# Patient Record
Sex: Male | Born: 1975 | Race: Black or African American | Hispanic: No | Marital: Married | Smoking: Never smoker
Health system: Southern US, Community
[De-identification: ages and names within clinical notes are randomized; demographics above are authoritative.]

## PROBLEM LIST (undated history)

## (undated) DIAGNOSIS — E119 Type 2 diabetes mellitus without complications: Secondary | ICD-10-CM

## (undated) DIAGNOSIS — F909 Attention-deficit hyperactivity disorder, unspecified type: Secondary | ICD-10-CM

## (undated) DIAGNOSIS — E785 Hyperlipidemia, unspecified: Secondary | ICD-10-CM

## (undated) DIAGNOSIS — I10 Essential (primary) hypertension: Secondary | ICD-10-CM

## (undated) HISTORY — PX: KNEE ARTHROSCOPY: SUR90

## (undated) HISTORY — PX: SHOULDER SURGERY: SHX246

## (undated) HISTORY — DX: Attention-deficit hyperactivity disorder, unspecified type: F90.9

## (undated) HISTORY — PX: NASAL SEPTUM SURGERY: SHX37

## (undated) HISTORY — DX: Hyperlipidemia, unspecified: E78.5

---

## 2014-04-26 ENCOUNTER — Ambulatory Visit: Payer: Self-pay | Admitting: Endocrinology

## 2014-04-27 ENCOUNTER — Emergency Department (HOSPITAL_COMMUNITY)
Admission: EM | Admit: 2014-04-27 | Discharge: 2014-04-27 | Disposition: A | Discharge status: Home / Self Care | Payer: BLUE CROSS/BLUE SHIELD | Attending: Emergency Medicine | Admitting: Emergency Medicine

## 2014-04-27 ENCOUNTER — Encounter (HOSPITAL_COMMUNITY): Payer: Self-pay | Admitting: Emergency Medicine

## 2014-04-27 DIAGNOSIS — R112 Nausea with vomiting, unspecified: Secondary | ICD-10-CM

## 2014-04-27 DIAGNOSIS — R6883 Chills (without fever): Secondary | ICD-10-CM | POA: Diagnosis not present

## 2014-04-27 DIAGNOSIS — I10 Essential (primary) hypertension: Secondary | ICD-10-CM | POA: Insufficient documentation

## 2014-04-27 DIAGNOSIS — E109 Type 1 diabetes mellitus without complications: Secondary | ICD-10-CM | POA: Diagnosis not present

## 2014-04-27 HISTORY — DX: Type 2 diabetes mellitus without complications: E11.9

## 2014-04-27 HISTORY — DX: Essential (primary) hypertension: I10

## 2014-04-27 LAB — URINE MICROSCOPIC-ADD ON

## 2014-04-27 LAB — CBC WITH DIFFERENTIAL/PLATELET
BASOS ABS: 0 10*3/uL (ref 0.0–0.1)
BASOS PCT: 0 % (ref 0–1)
EOS ABS: 0.1 10*3/uL (ref 0.0–0.7)
Eosinophils Relative: 2 % (ref 0–5)
HCT: 45.9 % (ref 39.0–52.0)
Hemoglobin: 15.3 g/dL (ref 13.0–17.0)
LYMPHS ABS: 0.5 10*3/uL — AB (ref 0.7–4.0)
LYMPHS PCT: 6 % — AB (ref 12–46)
MCH: 26.6 pg (ref 26.0–34.0)
MCHC: 33.3 g/dL (ref 30.0–36.0)
MCV: 79.7 fL (ref 78.0–100.0)
Monocytes Absolute: 0.7 10*3/uL (ref 0.1–1.0)
Monocytes Relative: 9 % (ref 3–12)
NEUTROS PCT: 83 % — AB (ref 43–77)
Neutro Abs: 6.7 10*3/uL (ref 1.7–7.7)
PLATELETS: 220 10*3/uL (ref 150–400)
RBC: 5.76 MIL/uL (ref 4.22–5.81)
RDW: 13.5 % (ref 11.5–15.5)
WBC: 8 10*3/uL (ref 4.0–10.5)

## 2014-04-27 LAB — COMPREHENSIVE METABOLIC PANEL
ALT: 30 U/L (ref 0–53)
AST: 34 U/L (ref 0–37)
Albumin: 3.7 g/dL (ref 3.5–5.2)
Alkaline Phosphatase: 87 U/L (ref 39–117)
Anion gap: 10 (ref 5–15)
BUN: 14 mg/dL (ref 6–23)
CALCIUM: 8 mg/dL — AB (ref 8.4–10.5)
CO2: 23 mmol/L (ref 19–32)
Chloride: 102 mmol/L (ref 96–112)
Creatinine, Ser: 1.23 mg/dL (ref 0.50–1.35)
GFR calc Af Amer: 83 mL/min — ABNORMAL LOW (ref >90)
GFR, EST NON AFRICAN AMERICAN: 72 mL/min — AB (ref >90)
Glucose, Bld: 254 mg/dL — ABNORMAL HIGH (ref 70–99)
Potassium: 4 mmol/L (ref 3.5–5.1)
SODIUM: 135 mmol/L (ref 135–145)
Total Bilirubin: 0.8 mg/dL (ref 0.3–1.2)
Total Protein: 6.7 g/dL (ref 6.0–8.3)

## 2014-04-27 LAB — URINALYSIS, ROUTINE W REFLEX MICROSCOPIC
Glucose, UA: >1000 mg/dL — AB
HGB URINE DIPSTICK: NEGATIVE
Ketones, ur: >80 mg/dL — AB
LEUKOCYTES UA: NEGATIVE
NITRITE: NEGATIVE
Protein, ur: 100 mg/dL — AB
SPECIFIC GRAVITY, URINE: 1.037 — AB (ref 1.005–1.030)
Urobilinogen, UA: 0.2 mg/dL (ref 0.0–1.0)
pH: 5 (ref 5.0–8.0)

## 2014-04-27 LAB — LIPASE, BLOOD: Lipase: 24 U/L (ref 11–59)

## 2014-04-27 LAB — I-STAT TROPONIN, ED: TROPONIN I, POC: 0.01 ng/mL (ref 0.00–0.08)

## 2014-04-27 LAB — CBG MONITORING, ED: Glucose-Capillary: 210 mg/dL — ABNORMAL HIGH (ref 70–99)

## 2014-04-27 MED ORDER — ONDANSETRON HCL 4 MG/2ML IJ SOLN
4.0000 mg | Freq: Once | INTRAMUSCULAR | Status: AC
  Administered 2014-04-27: 4 mg via INTRAVENOUS
  Filled 2014-04-27: qty 2

## 2014-04-27 MED ORDER — SODIUM CHLORIDE 0.9 % IV BOLUS (SEPSIS)
1000.0000 mL | Freq: Once | INTRAVENOUS | Status: AC
  Administered 2014-04-27: 1000 mL via INTRAVENOUS

## 2014-04-27 MED ORDER — ONDANSETRON 4 MG PO TBDP: 4.0000 mg | ORAL_TABLET | Freq: Three times a day (TID) | ORAL | Status: DC | PRN

## 2014-04-27 NOTE — ED Provider Notes (Signed)
CSN: [id number]     Arrival date & time 04/27/14  0801 History   First MD Initiated Contact with Patient 04/27/14 [phone]     Chief Complaint  Patient presents with  . Emesis     (Consider location/radiation/quality/duration/timing/severity/associated sxs/prior Treatment) The history is provided by the patient and medical records.   This is a 39 y.o. M with PMH significant for HTN, DM1 on insulin pump, presenting to the ED for nausea and vomiting onset this morning around 0300.  Patient states he has had approx 4 episodes of non-bloody, non-bilious emesis since onset.  He endorses generalized abdominal cramping which he attributes to vomiting, no focal tenderness.  He reports cold sweats and chills with vomiting but denies fever.  Denies recent changes in diet, last PO intake last evening was pizza.  Patient is type 1 diabetic on insulin pump.  States his CBG's have been running higher than normal lately so he changed his port with improvement of readings.  CBG here 210.   No chest pain, SOB, palpitations, dizziness, weakness.  VSS on arrival. PCP [facility], now switching [facility] but has not established care yet.  No past medical history on file. No past surgical history on file. No family history on file. History  Substance Use Topics  . Smoking status: Not on file  . Smokeless tobacco: Not on file  . Alcohol Use: Not on file    Review of Systems  Constitutional: Positive for chills.  Gastrointestinal: Positive for nausea and vomiting.  All other systems reviewed and are negative.     Allergies  Review of patient's allergies indicates not on file.  Home Medications   Prior to Admission medications   Not on File   BP 157/97 mmHg  Pulse 105  Temp(Src) 99.5 F (37.5 C) (Oral)  Resp 20  SpO2 99%   Physical Exam  Constitutional: He is oriented to person, place, and time. He appears well-developed and well-nourished.  HENT:  Head:  Normocephalic and atraumatic.  Mouth/Throat: Uvula is midline and oropharynx is clear and moist.  Mildly dry mucous membranes  Eyes: Conjunctivae and EOM are normal. Pupils are equal, round, and reactive to light.  Neck: Normal range of motion.  Cardiovascular: Normal rate, regular rhythm and normal heart sounds.   Pulmonary/Chest: Effort normal and breath sounds normal.  Abdominal: Soft. Bowel sounds are normal. There is no tenderness. There is no rigidity, no guarding and no CVA tenderness.  Abdomen soft, non-distended, no focal tenderness or peritoneal signs  Musculoskeletal: Normal range of motion.  Neurological: He is alert and oriented to person, place, and time.  Skin: Skin is warm and dry.  Psychiatric: He has a normal mood and affect.  Nursing note and vitals reviewed.   ED Course  Procedures (including critical care time) Labs Review Labs Reviewed  CBC WITH DIFFERENTIAL/PLATELET - Abnormal; Notable for the following:    Neutrophils Relative % 83 (*)    Lymphocytes Relative 6 (*)    Lymphs Abs 0.5 (*)    All other components within normal limits  COMPREHENSIVE METABOLIC PANEL - Abnormal; Notable for the following:    Glucose, Bld 254 (*)    Calcium 8.0 (*)    GFR calc non Af Amer 72 (*)    GFR calc Af Amer 83 (*)    All other components within normal limits  URINALYSIS, ROUTINE W REFLEX MICROSCOPIC - Abnormal; Notable for the following:    Specific Gravity,  Urine 1.037 (*)    Glucose, UA >1000 (*)    Bilirubin Urine SMALL (*)    Ketones, ur >80 (*)    Protein, ur 100 (*)    All other components within normal limits  CBG MONITORING, ED - Abnormal; Notable for the following:    Glucose-Capillary 210 (*)    All other components within normal limits  LIPASE, BLOOD  URINE MICROSCOPIC-ADD ON  I-STAT TROPOININ, ED    Imaging Review No results found.   EKG Interpretation   Date/Time:  Friday April 27 2014 09:10:27 EST Ventricular Rate:  103 PR Interval:   167 QRS Duration: 90 QT Interval:  359 QTC Calculation: 470 R Axis:   98 Text Interpretation:  Sinus tachycardia Atrial premature complexes  Consider left atrial enlargement Borderline right axis deviation No  previous ECGs available Confirmed by Bebe Shaggy  MD, Dorinda Hill ([id number]) on  04/27/2014 9:15:31 AM      MDM   Final diagnoses:  Nausea and vomiting, vomiting of unspecified type   39 year old male with nausea and vomiting onset this morning. Patient is type I diabetic. Patient currently afebrile and nontoxic in appearance. His abdominal exam is benign. She does have mildly dry mucous membranes. He reports recent glucose has been higher than normal but improved after changing his port on his insulin pump. CBG here 210. Will obtain labs. IV fluids and Zofran given.  10:00 AM  Lab work reassuring, normal WBC count.  Glucose elevated at 254 however anion gap remains WNL at 10.  Bicarb also WNL.  Clinically not DKA.  Patient has been hydrated here in the ED and states he is feeling better.  Abdominal exam remains benign.  Will PO challenge, anticipate discharge.  Patient has tolerated PO without difficulty.  No recurrence of symptoms.  Patient has been mildly tachycardic in the ED, suspect due to dehydration.  No chest pain or SOB to suggest PE.  Encouraged to continue fluids at home and monitor CBG closely.  Rx zofran.  FU with PCP.  Discussed plan with patient, he/she acknowledged understanding and agreed with plan of care.  Return precautions given for new or worsening symptoms.  Garlon Hatchet, PA-C 04/27/14 1426  Joya Gaskins, MD 04/28/14 [phone]

## 2014-04-27 NOTE — ED Notes (Addendum)
Pt reports sudden onset of nausea, vomiting this AM around 0300. Reports vomited atleast 4 times. Reports chills. States type 1 diabetic.

## 2014-04-27 NOTE — Discharge Instructions (Signed)
Take the prescribed medication as directed for nausea.  May wish to start with bland diet and progress back to normal as tolerated. Continue monitoring CBG closely. Follow-up with your primary care physician. Return to the ED for new or worsening symptoms.

## 2014-04-27 NOTE — ED Notes (Signed)
Notified RN of CBG 210

## 2014-06-26 ENCOUNTER — Encounter: Payer: Self-pay | Admitting: Internal Medicine

## 2014-06-26 ENCOUNTER — Ambulatory Visit (INDEPENDENT_AMBULATORY_CARE_PROVIDER_SITE_OTHER): Payer: BLUE CROSS/BLUE SHIELD | Admitting: Internal Medicine

## 2014-06-26 VITALS — BP 130/88 | HR 95 | Temp 98.3°F | Resp 12 | Ht 71.0 in | Wt 255.0 lb

## 2014-06-26 DIAGNOSIS — E1065 Type 1 diabetes mellitus with hyperglycemia: Secondary | ICD-10-CM

## 2014-06-26 DIAGNOSIS — IMO0002 Reserved for concepts with insufficient information to code with codable children: Secondary | ICD-10-CM

## 2014-06-26 DIAGNOSIS — Z7689 Persons encountering health services in other specified circumstances: Secondary | ICD-10-CM

## 2014-06-26 DIAGNOSIS — Z7189 Other specified counseling: Secondary | ICD-10-CM

## 2014-06-26 LAB — COMPREHENSIVE METABOLIC PANEL
ALT: 28 U/L (ref 0–53)
AST: 15 U/L (ref 0–37)
Albumin: 4.1 g/dL (ref 3.5–5.2)
Alkaline Phosphatase: 90 U/L (ref 39–117)
BUN: 18 mg/dL (ref 6–23)
CALCIUM: 9.1 mg/dL (ref 8.4–10.5)
CO2: 30 mEq/L (ref 19–32)
CREATININE: 1.15 mg/dL (ref 0.40–1.50)
Chloride: 99 mEq/L (ref 96–112)
GFR: 90.23 mL/min (ref >60.00)
Glucose, Bld: 320 mg/dL — ABNORMAL HIGH (ref 70–99)
Potassium: 4.1 mEq/L (ref 3.5–5.1)
SODIUM: 132 meq/L — AB (ref 135–145)
Total Bilirubin: 0.6 mg/dL (ref 0.2–1.2)
Total Protein: 6.8 g/dL (ref 6.0–8.3)

## 2014-06-26 LAB — TSH: TSH: 0.99 u[IU]/mL (ref 0.35–4.50)

## 2014-06-26 LAB — HEMOGLOBIN A1C: HEMOGLOBIN A1C: 10.2 % — AB (ref 4.6–6.5)

## 2014-06-26 MED ORDER — GLUCAGON (RDNA) 1 MG IJ KIT: 1.0000 mg | PACK | Freq: Once | INTRAMUSCULAR | Status: DC | PRN

## 2014-06-26 NOTE — Patient Instructions (Addendum)
Please change the pump settings as follows: - basal rates: 12 am: 2.0 units/h - ICR:  12 am: 8 >> 6 5 pm: 7 >> 6 - target: 100-100 - ISF: 20 - Insulin on Board: 4h - bolus wizard: on  Please check sugars before every meal and start the boluses 15 min before a meal.  Please change the pump site every 3 days.  Please stop at the lab.  Please return in 5 weeks.  Please schedule an appt with Cristy FolksLinda Spagnola for diabetes education in ~2-3 weeks.  Basic Rules for Patients with Type I Diabetes Mellitus  1. The American Diabetes Association (ADA) recommended targets: - fasting sugar <130 - after meal sugar <180 - HbA1C <7%  2. Engage in ?150 min moderate exercise per week  3. Make sure you have ?8h of sleep every night as this helps both blood sugars and your weight.  4. Always keep a sugar log (not only record in your meter) and bring it to all appointments with us.  5. If you are on a pump, know how to access the settings and to modify the parameters.  6.  Remember, you can always call the number on the back of the pump for emergencies related to the pump.  7. "15-15 rule" for hypoglycemia: if sugars are low, take 15 g of carbs** ("fast sugar" - e.g. 4 glucose tablets, 4 oz orange juice), wait 15 min, then check sugars again. If still <80, repeat. Continue  until your sugars >80, then eat a normal meal.   8. Teach family members and coworkers to inject glucagon. Have a glucagon set at home and one at work. They should call 911 after using the set.  9. If you are on a pump, set "insulin on board" time for 5 hours (if your sugars tend to be higher, can use 4 hours).   10. If you are on a pump, use the "dual wave bolus" setting for high fat foods (e.g. pizza). Start with a setting of 50%-50% (50% instant bolus and 50% prolonged bolus over 3h, for e.g.).    11. If you are on a pump, make sure the basal daily insulin dose is approximately equal (not larger) to the daily insulin you  get from boluses, otherwise you are at risk for hypoglycemia.  12. Check sugar before driving. If <100, correct, and only start driving if sugars rise ?829100. Check sugar every hour when on a long drive.  13. Check sugar before exercising. If <100, correct, and only start exercising if sugars rise ?100. Check sugar every hour when on a long exercise routine and 1h after you finished exercising.   If >250, check urine for ketones. If you have moderate-large ketones in urine, do not start exercise. Hydrate yourself with clear liquids and correct the high sugar. Recheck sugars and ketones before attempting to exercise.  Be aware that you might need less insulin when exercising.  *intense, short, exercise bursts can increase your sugars, but  *less intense, longer (>1h), exercise routines can decrease your sugars.  If you are on a pump, you might need to decrease your basal rate by 10% or more (or even disconnect your pump) while you exercise to prevent low sugars. Do not disconnect your pump by more than 3 hours at a time! You also might need to decrease your insulin bolus for the meal prior to your exercise time by 20% or more.  14. Make sure you have a MedAlert bracelet or pendant  mentioning "Type I Diabetes Mellitus". If you have a prior episode of severe hypoglycemia or hypoglycemia unawareness, it should also mention this.  15. Please do not walk barefoot. Inspect your feet for sores/cuts and let us know if you have them.  16. Please call [facility] with any questions and concerns [phone]).   **E.g. of "fast carbs": ? first choice (15 g):  1 tube glucose gel, GlucoPouch 15, 2 oz glucose liquid ? second choice (15-16 g):  3 or 4 glucose tablets (best taken  with water), 15 Dextrose Bits chewable ? third choice (15-20 g):   cup fruit juice,  cup regular soda, 1 cup skim milk,  1 cup sports drink ? fourth choice (15-20 g):  1 small tube [place] gel (not  frosting), 2 tbsp raisins, 1 tbsp table sugar,  candy, jelly beans, gum drops - check package for carb amount   (adapted from: Juluis Rainier. "Insulin therapy and hypoglycemia" Endocrinol Metab Clin N Am 2012, 41: 57-87)

## 2014-06-26 NOTE — Progress Notes (Signed)
Patient ID: Johnny Hicks, male   DOB: [birth year], 39 y.o.   MRN: [id number]  HPI: Johnny Hicks is a 39 y.o.-year-old male, self- referred, for management of DM1, dx 2001, uncontrolled, without complications.  Last hemoglobin A1c was: 11/2013: HbA1c in the 9's 04/08/2012: GAD Ab 4.8 (<1.0)  Pt is on an insulin pump: Paradigm Revel 723 (Medtronic) - 39 years old, without a CGM, uses Novolog in the pump.  Pump settings: - basal rates: 12 am: 2.0 units/h TDD from basal insulin: 46.3 units - ICR:  12 am: 8 5 pm: 7 - target: 100-100 - ISF: 20 - Insulin on Board: 4h - bolus wizard: on, uses it 100% time TDD from bolus insulin: 40.3 units - extended bolusing: not using - changes infusion site: q3-4 days - Meter: Bayer Contour Link  Pt checks his sugars 2.4x a day and they are - ave 341 +/- 151:  - am: 58-370 - 2h after b'fast: 205-370 - before lunch: 205>400 - 2h after lunch: 47-400 - before dinner: 192->400 - 2h after dinner: 197->400 - bedtime: see above - nighttime: >400 No lows. Lowest sugar was 40 - when dieting; he has hypoglycemia awareness at 60-70. No previous hypoglycemia admission. Has an expired glucagon kit at home. Highest sugar was Hi. No previous DKA admissions.    Pt's meals are: - Breakfast: usually skips - Brunch (10-11 am): meat + starch + veggies (60g carbs) - Dinner (4-5 pm or later if busy at work): ? - Snacks: usually not; but recently had some at night: chips, etc.  He was previously on a restricted diet at the beginning of the year and he started to have low CBGs, so he had to adjust his pump settings then. He walks or plays basketball 3-5 times a week.  - no CKD, last BUN/creatinine:  Lab Results  Component Value Date   BUN 14 04/27/2014   CREATININE 1.23 04/27/2014  On Lisinopril. - Has HL He was on Lipitor 20. Now off for 3 months.  - last eye exam was in 2105. No DR.  - no numbness and tingling in his feet. - + decreased  erections  Pt has FH of DM in brother.  ROS: Constitutional: no weight gain/loss, no fatigue, no subjective hyperthermia/hypothermia Eyes: no blurry vision, no xerophthalmia ENT: no sore throat, no nodules palpated in throat, no dysphagia/odynophagia, no hoarseness Cardiovascular: no CP/SOB/palpitations/leg swelling Respiratory: no cough/SOB Gastrointestinal: no N/V/D/C Musculoskeletal: no muscle/joint aches Skin: no rashes Neurological: no tremors/numbness/tingling/dizziness Psychiatric: no depression/anxiety + Low libido, problems with erections   Past Medical History  Diagnosis Date  . Diabetes mellitus without complication   . Hypertension    Past Surgical History  Procedure Laterality Date  . Shoulder surgery    . Knee arthroscopy     History   Social History  . Marital Status: Married    Spouse Name: N/A  . Number of Children: 43 y/o   Occupational History  . Sales - medical devices    Social History Main Topics  . Smoking status: Never Smoker   . Smokeless tobacco: Not on file  . Alcohol Use: Yes     Comment: Bourbon 2 drinks twice a month   . Drug Use: No   Current Outpatient Prescriptions on File Prior to Visit  Medication Sig Dispense Refill  . glucose blood test strip 1 each by Other route 3 (three) times daily. Use as instructed    . insulin aspart (NOVOLOG) 100 UNIT/ML injection Inject  into the skin continuous. Insulin pump.  Administer boluses 3-4 times daily.    [name] lisdexamfetamine (VYVANSE) 20 MG capsule Take 20 mg by mouth daily as needed.    [name] lisdexamfetamine (VYVANSE) 40 MG capsule Take 40 mg by mouth every morning.    [name] atorvastatin (LIPITOR) 20 MG tablet Take 20 mg by mouth daily.    [name] ibuprofen (ADVIL,MOTRIN) 200 MG tablet Take 600 mg by mouth every 6 (six) hours as needed.    [name] lisinopril-hydrochlorothiazide (PRINZIDE,ZESTORETIC) 10-12.5 MG per tablet Take 1 tablet by mouth daily.    . ondansetron (ZOFRAN ODT) 4 MG disintegrating tablet Take  1 tablet (4 mg total) by mouth every 8 (eight) hours as needed for nausea. (Patient not taking: Reported on 06/26/2014) 12 tablet 0   No current facility-administered medications on file prior to visit.   Allergies  Allergen Reactions  . Penicillins Other (See Comments)    Pt does not remember last had as a baby   Family history: -  Brother died from complications of type 1 diabetes -  Mother and sister with hypertension  -  Sister with hyperlipidemia  -  Sister and father (deceased) with heart disease   PE: BP 130/88 mmHg  Pulse 95  Temp(Src) 98.3 F (36.8 C) (Oral)  Resp 12  Ht _0  (1.803 m)  Wt 255 lb (115.667 kg)  BMI 35.58 kg/m2  SpO2 97% Wt Readings from Last 3 Encounters:  06/26/14 255 lb (115.667 kg)   Constitutional: overweight, in NAD Eyes: PERRLA, EOMI, no exophthalmos ENT: moist mucous membranes, no thyromegaly, no cervical lymphadenopathy Cardiovascular: RRR, No MRG Respiratory: CTA B Gastrointestinal: abdomen soft, NT, ND, BS+ Musculoskeletal: no deformities, strength intact in all 4 Skin: moist, warm, no rashes Neurological: no tremor with outstretched hands, DTR normal in all 4  ASSESSMENT: 1. DM1, uncontrolled, without complications  PLAN:  1. Patient with long-standing, uncontrolled DM1, on insulin pump therapy. Patient checks his sugars sporadically and also does not enter his carbs into his pump. Subsequently, he ends up bolusing from 0 to several times a day. Sugars are still high if you boluses several times a day. Therefore, I will increase his boluses by decreasing his insulin to carb ratio. However, I explained the most important thing that he can do to improve his diabetes is check his sugars before meals and bolus with every meal. I strongly encouraged him to start the boluses 15 minutes before a meal, which she is not doing now.  I advised him to schedule an appointment with Leonia Reader with diabetes education about 2-3 weeks after this  visit, and I will see him back in 5 weeks. - We discussed about changes to his insulin regimen, as follows:  Patient Instructions  Please change the pump settings as follows: - basal rates: 12 am: 2.0 units/h - ICR:  12 am: 8 >> 6 5 pm: 7 >> 6 - target: 100-100 - ISF: 20 - Insulin on Board: 4h - bolus wizard: on  Please check sugars before every meal and start the boluses 15 min before a meal.  Please change the pump site every 3 days.  Please stop at the lab.  Please return in 5 weeks.  Please schedule an appt with Leonia Reader for diabetes education in ~2-3 weeks.  - Strongly advised him to start checking sugars at different times of the day - check at least 4 times a day, rotating checks - given sugar log and advised how to fill  it and to bring it at next appt  - given foot care handout and explained the principles  - given instructions for hypoglycemia management "15-15 rule"  - advised for yearly eye exams - sent 2x glucagon kits Rx to pharmacy - advised to get ketone strips - advised to always have Glu tablets with him - advised for a Med-alert bracelet mentioning "type 1 diabetes mellitus". - will refer to DM education for further help with the pump: carb counting check, basal rate validation, extended bolusing, sick days rules, etc. - given instruction Re: exercising and driving in DM1 (pt instructions) - no signs of other autoimmune disorders, but will check a TSH today  - I will also check a hemoglobin A1c today along with his kidney and liver function panels. Since the visit was scheduled right after lunch, I will not check a lipid profile today - I also referred him ao a primary care doctor within the [facility] - Return to clinic in 5 weeks with sugar log   - time spent with the patient: 1 hour, of which >50% was spent in obtaining information about his diabetes,  reviewing previous labs, records, and DM treatments, counseling pt about his condition (please  see the discussed topics above), and developing a plan to prevent further hypoglycemia and hyperglycemia. We also discussed about proper diet. Pt had a number of questions which I addressed.  Office Visit on 06/26/2014  Component Date Value Ref Range Status  . TSH 06/26/2014 0.99  0.35 - 4.50 uIU/mL Final  . Hgb A1c MFr Bld 06/26/2014 10.2* 4.6 - 6.5 % Final   Glycemic Control Guidelines for People with Diabetes:Non Diabetic:  <6%Goal of Therapy: <7%Additional Action Suggested:  >8%   . Sodium 06/26/2014 132* 135 - 145 mEq/L Final  . Potassium 06/26/2014 4.1  3.5 - 5.1 mEq/L Final  . Chloride 06/26/2014 99  96 - 112 mEq/L Final  . CO2 06/26/2014 30  19 - 32 mEq/L Final  . Glucose, Bld 06/26/2014 320* 70 - 99 mg/dL Final  . BUN 06/26/2014 18  6 - 23 mg/dL Final  . Creatinine, Ser 06/26/2014 1.15  0.40 - 1.50 mg/dL Final  . Total Bilirubin 06/26/2014 0.6  0.2 - 1.2 mg/dL Final  . Alkaline Phosphatase 06/26/2014 90  39 - 117 U/L Final  . AST 06/26/2014 15  0 - 37 U/L Final  . ALT 06/26/2014 28  0 - 53 U/L Final  . Total Protein 06/26/2014 6.8  6.0 - 8.3 g/dL Final  . Albumin 06/26/2014 4.1  3.5 - 5.2 g/dL Final  . Calcium 06/26/2014 9.1  8.4 - 10.5 mg/dL Final  . GFR 06/26/2014 90.23  >60.00 mL/min Final   Hemoglobin A1c high. Glucose very high. TSH normal. LFTs normal. GFR normal.

## 2014-07-16 ENCOUNTER — Ambulatory Visit: Payer: BLUE CROSS/BLUE SHIELD | Admitting: Family

## 2014-07-18 ENCOUNTER — Encounter: Payer: BLUE CROSS/BLUE SHIELD | Attending: Internal Medicine | Admitting: Nutrition

## 2014-07-18 DIAGNOSIS — Z794 Long term (current) use of insulin: Secondary | ICD-10-CM | POA: Diagnosis not present

## 2014-07-18 DIAGNOSIS — Z713 Dietary counseling and surveillance: Secondary | ICD-10-CM | POA: Insufficient documentation

## 2014-07-18 DIAGNOSIS — IMO0002 Reserved for concepts with insufficient information to code with codable children: Secondary | ICD-10-CM

## 2014-07-18 DIAGNOSIS — E1065 Type 1 diabetes mellitus with hyperglycemia: Secondary | ICD-10-CM | POA: Diagnosis not present

## 2014-07-18 NOTE — Progress Notes (Signed)
Melburn PopperM. Hancox is on a Medtronic insulin pump.  He reports that he is not always taking his insulin before meals--mostly doing this after eating.  Nor is he always testing his blood sugars.  We discussed how the insulin won't begin to work for 15 min. After taking, and that his blood sugar is rising while he is eating, and the insulin is less potent when blood sugars are higher.  He did not know this, and said will try to take this before eating.  Also stressed the need to test--to always bring the blood sugars down.  Stressed the need to do this at HS to prevent high blood sugars all night.  He agreed to do this.  He is also exercising with no IOB, resulting in a high reading afterwards--250-300. Suggested that, if his blood sugars are less than 200 before exercising, to he take 1/2u before exercise, and if over 250, he will need to 1/2 of the correction dose  and wait 1 hour before exercise.  He agreed to do this.  He says that his ADHD medication he takes in the AM, causes him to not want to eat breakfast, and he will eat his first meal around 1-2PM.  Says never has low blood sugars and that his morning reading is similar to his acL reading.     He is not duel waving high fat meals, but told him at his next visit, I will explain how/why he needs to do this.    He had no final questions.

## 2014-07-18 NOTE — Patient Instructions (Signed)
Test blood sugars and take insulin before all meals and snacks Test blood sugar at bedtime and do a correction bolus to bring down morning blood sugars reading.

## 2014-07-31 ENCOUNTER — Ambulatory Visit (INDEPENDENT_AMBULATORY_CARE_PROVIDER_SITE_OTHER): Payer: BLUE CROSS/BLUE SHIELD | Admitting: Family

## 2014-07-31 ENCOUNTER — Encounter: Payer: Self-pay | Admitting: Family

## 2014-07-31 VITALS — BP 150/104 | HR 96 | Temp 98.7°F | Resp 18 | Ht 71.0 in | Wt 262.0 lb

## 2014-07-31 DIAGNOSIS — I10 Essential (primary) hypertension: Secondary | ICD-10-CM | POA: Diagnosis not present

## 2014-07-31 DIAGNOSIS — N529 Male erectile dysfunction, unspecified: Secondary | ICD-10-CM | POA: Diagnosis not present

## 2014-07-31 DIAGNOSIS — E1065 Type 1 diabetes mellitus with hyperglycemia: Secondary | ICD-10-CM | POA: Diagnosis not present

## 2014-07-31 DIAGNOSIS — IMO0002 Reserved for concepts with insufficient information to code with codable children: Secondary | ICD-10-CM

## 2014-07-31 MED ORDER — GLUCOSE BLOOD VI STRP: ORAL_STRIP | Status: DC

## 2014-07-31 MED ORDER — LISINOPRIL-HYDROCHLOROTHIAZIDE 10-12.5 MG PO TABS: 1.0000 | ORAL_TABLET | Freq: Every day | ORAL | Status: DC

## 2014-07-31 MED ORDER — ATORVASTATIN CALCIUM 20 MG PO TABS: 20.0000 mg | ORAL_TABLET | Freq: Every day | ORAL | Status: DC

## 2014-07-31 MED ORDER — SILDENAFIL CITRATE 50 MG PO TABS: 50.0000 mg | ORAL_TABLET | Freq: Every day | ORAL | Status: DC | PRN

## 2014-07-31 NOTE — Assessment & Plan Note (Signed)
Type 1 diabetes remains uncontrolled with A1c of 10.2. Currently maintained on NovoLog through insulin pump and managed by endocrinology. Continue current dosage of NovoLog. Refill glucose test strips. Refer to ophthalmology for diabetic eye exam.

## 2014-07-31 NOTE — Assessment & Plan Note (Signed)
Patient describes symptoms of decreased libido and erectile dysfunction. Obtain testosterone and complete metabolic panel. Cannot rule out diabetes and hypertension as potential causes. Start Viagra as needed. Follow-up if symptoms worsen or fail to improve.

## 2014-07-31 NOTE — Progress Notes (Signed)
Subjective:    Patient ID: Johnny Hicks, male    DOB: 1975/05/16, 39 y.o.   MRN: [id number]  Chief Complaint  Patient presents with  . Establish Care    HPI:  Johnny Hicks is a 39 y.o. male with a PMH of type 1 diabetes, hypertension, and ADHD who presents today for an office visit to establish care.     1.) Hypertension - Previously diagnosed with hypertension and treated with lisinopril-hydrochlorothiazide. Also is noted to have ADHD and treated with Vyvanse that may not be helping. Has been out of his medication for about the last 6 months. Denies any adverse effects when he was taking the medication.  BP Readings from Last 3 Encounters:  07/31/14 150/104  06/26/14 130/88  04/27/14 154/90    2.) Erectile dysfunction - Associated symptoms of decrease libido and decreased erection has been going on for about 3 years with progressive worsening. Denies any modifying factors that make it better or worse. Does also have hypertension and diabetes.   3.) Diabetes - Previously uncontrolled Type 1 diabetes managed with an insulin pump and Dr. Elvera LennoxGherghe of Endocrinology. He is currently taking Novolog through an insulin pump.   Lab Results  Component Value Date   HGBA1C 10.2* 06/26/2014    Review of Systems  Respiratory: Negative for chest tightness and shortness of breath.   Cardiovascular: Negative for chest pain, palpitations and leg swelling.  Neurological: Negative for headaches.      Objective:    BP 150/104 mmHg  Pulse 96  Temp(Src) 98.7 F (37.1 C) (Oral)  Resp 18  Ht 5\' 11"  (1.803 m)  Wt 262 lb (118.842 kg)  BMI 36.56 kg/m2  SpO2 97% Nursing note and vital signs reviewed.  Physical Exam  Constitutional: He is oriented to person, place, and time. He appears well-developed and well-nourished. No distress.  Cardiovascular: Normal rate, regular rhythm, normal heart sounds and intact distal pulses.   Pulmonary/Chest: Effort normal and breath sounds normal.    Neurological: He is alert and oriented to person, place, and time.  Skin: Skin is warm and dry.  Psychiatric: He has a normal mood and affect. His behavior is normal. Judgment and thought content normal.       Assessment & Plan:   Problem List Items Addressed This Visit      Cardiovascular and Mediastinum   Essential hypertension - Primary    Blood pressure remains uncontrolled and greater than goal of 140/90. Has not taken medication in 6 months. This is also confounded by patient's use of Vyvanse for ADHD prescribed by the tension specialists. Restart lisinopril-hydrochlorothiazide. Monitor blood pressure at home. Obtain complete metabolic panel to check kidney function. Refer to ophthalmology for eye exam. Follow up in one month to recheck blood pressure.      Relevant Medications   lisinopril-hydrochlorothiazide (PRINZIDE,ZESTORETIC) 10-12.5 MG per tablet   atorvastatin (LIPITOR) 20 MG tablet   sildenafil (VIAGRA) 50 MG tablet   Other Relevant Orders   Comprehensive metabolic panel   Lipid Profile   Ambulatory referral to Ophthalmology     Endocrine   Type 1 diabetes mellitus, uncontrolled    Type 1 diabetes remains uncontrolled with A1c of 10.2. Currently maintained on NovoLog through insulin pump and managed by endocrinology. Continue current dosage of NovoLog. Refill glucose test strips. Refer to ophthalmology for diabetic eye exam.      Relevant Medications   lisinopril-hydrochlorothiazide (PRINZIDE,ZESTORETIC) 10-12.5 MG per tablet   atorvastatin (LIPITOR) 20 MG tablet  glucose blood (BAYER CONTOUR NEXT TEST) test strip   Other Relevant Orders   Lipid Profile   Ambulatory referral to Ophthalmology     Genitourinary   Erectile dysfunction    Patient describes symptoms of decreased libido and erectile dysfunction. Obtain testosterone and complete metabolic panel. Cannot rule out diabetes and hypertension as potential causes. Start Viagra as needed. Follow-up if  symptoms worsen or fail to improve.      Relevant Medications   sildenafil (VIAGRA) 50 MG tablet   Other Relevant Orders   Comprehensive metabolic panel   Lipid Profile   Testosterone

## 2014-07-31 NOTE — Patient Instructions (Addendum)
Thank you for choosing [facility].  Summary/Instructions:  Your prescription(s) have been submitted to your pharmacy or been printed and provided for you. Please take as directed and contact our office if you believe you are having problem(s) with the medication(s) or have any questions.  Please take your blood pressure at home.    Please continue to take your medication.   Please stop by the lab on the basement level of the building for your blood work. Your results will be released to MyChart (or called to you) after review, usually within 72 hours after test completion. If any changes need to be made, you will be notified at that same time.  Referrals have been made during this visit. You should expect to hear back from our schedulers in about 7-10 days in regards to establishing an appointment with the specialists we discussed.   If your symptoms worsen or fail to improve, please contact our office for further instruction, or in case of emergency go directly to the emergency room at the closest medical facility.

## 2014-07-31 NOTE — Progress Notes (Signed)
Pre visit review using our clinic review tool, if applicable. No additional management support is needed unless otherwise documented below in the visit note. 

## 2014-07-31 NOTE — Assessment & Plan Note (Signed)
Blood pressure remains uncontrolled and greater than goal of 140/90. Has not taken medication in 6 months. This is also confounded by patient's use of Vyvanse for ADHD prescribed by the tension specialists. Restart lisinopril-hydrochlorothiazide. Monitor blood pressure at home. Obtain complete metabolic panel to check kidney function. Refer to ophthalmology for eye exam. Follow up in one month to recheck blood pressure.

## 2014-08-02 ENCOUNTER — Encounter: Payer: Self-pay | Admitting: Internal Medicine

## 2014-08-02 ENCOUNTER — Ambulatory Visit (INDEPENDENT_AMBULATORY_CARE_PROVIDER_SITE_OTHER): Payer: BLUE CROSS/BLUE SHIELD | Admitting: Internal Medicine

## 2014-08-02 VITALS — BP 134/90 | HR 103 | Temp 98.2°F | Wt 264.0 lb

## 2014-08-02 DIAGNOSIS — E1065 Type 1 diabetes mellitus with hyperglycemia: Secondary | ICD-10-CM | POA: Diagnosis not present

## 2014-08-02 DIAGNOSIS — IMO0002 Reserved for concepts with insufficient information to code with codable children: Secondary | ICD-10-CM

## 2014-08-02 NOTE — Patient Instructions (Addendum)
Please change the pump settings as follows: -  basal rates: 12 am: 2.0 units/h >> 2.1 - ICR:  12 am: 6 5 pm: 6 - target: 100-100 - ISF: 20 >> 30 - Insulin on Board: 4h - bolus wizard: on  Try to take only 15 g carbs for correction if sugars <70, except if in the 30s-40s, take 30 g. Wait 15 min before rechecking sugars to see if you need repeated correction.  Please schedule an appt with Bonita QuinLinda in 1 month.  Please come back for a follow-up appointment in 2 months.

## 2014-08-02 NOTE — Progress Notes (Signed)
Patient ID: Johnny Hicks, male   DOB: 05-May-1975, 39 y.o.   MRN: [id number]  HPI: Johnny Hicks is a 39 y.o.-year-old male, returning for f/u for DM1, dx 2001, uncontrolled, without complications. Last visit 1.5 mo ago.  Last hemoglobin A1c was: Lab Results  Component Value Date   HGBA1C 10.2* 06/26/2014   Lab Results  Component Value Date   CREATININE 1.15 06/26/2014   11/2013: HbA1c in the 9's 04/08/2012: GAD Ab 4.8 (<1.0)  Pt is on an insulin pump: Paradigm Revel 723 ([employer]) - 39 years old, without a CGM (is not interested in one), uses Novolog in the pump.  Pump settings: - basal rates: - basal rates: 12 am: 2.0 units/h - ICR:  12 am: 8 >> 6 5 pm: 7 >> 6 - target: 100-100 - ISF: 20 - Insulin on Board: 4h - bolus wizard: on TDD from basal insulin: 45.8 units (49%) TDD from bolus insulin: 47.3 units (51%) - extended bolusing: not using - changes infusion site: q3-4 days - Meter: Bayer Contour Link  Pt checks his sugars 2.4x a day and they are - ave 341 +/- 151 >> 243 +/- 138: Now:  Last visit:  - am: 58-370 >> <40 x1, 155-189 - 2h after b'fast: 205-370 >> 238->400 - before lunch: 205>400 >> 53-314 - 2h after lunch: 47-400 >> 57-232 - before dinner: 192->400 >> 49-333 - 2h after dinner: 197->400 >> 56-308 - bedtime: see above >> 238->400 - nighttime: >400 >> <40->400 No lows. Lowest sugar was <40 (usually his low CBGs happen after correction of highs); he has hypoglycemia awareness at 60-70. No previous hypoglycemia admission. Has a glucagon kit at home. Highest sugar was Hi. No previous DKA admissions.    Pt's meals are: - Breakfast: usually skips - Brunch (10-11 am): meat + starch + veggies (60g carbs) - Dinner (4-5 pm or later if busy at work): ? - Snacks: usually not; but recently had some at night: chips, etc.  He was previously on a restricted diet at the beginning of the year and he started to have low CBGs, so he had to adjust his pump  settings then. He walks or plays basketball 3-5 times a week.  - no CKD, last BUN/creatinine:  Lab Results  Component Value Date   BUN 18 06/26/2014   CREATININE 1.15 06/26/2014  On Lisinopril. - Has HL He was on Lipitor 20. Now off for 3 months.  - last eye exam was in 2015. No DR.  - no numbness and tingling in his feet. - + decreased erections  ROS: Constitutional: no weight gain/loss, no fatigue, no subjective hyperthermia/hypothermia Eyes: no blurry vision, no xerophthalmia ENT: no sore throat, no nodules palpated in throat, no dysphagia/odynophagia, no hoarseness Cardiovascular: no CP/SOB/palpitations/leg swelling Respiratory: no cough/SOB Gastrointestinal: no N/V/D/C Musculoskeletal: no muscle/joint aches Skin: no rashes Neurological: no tremors/numbness/tingling/dizziness Psychiatric: no depression/anxiety + Low libido, problems with erections   Past Medical History  Diagnosis Date  . Hypertension   . Diabetes mellitus without complication     Type 1  . ADHD (attention deficit hyperactivity disorder)    Past Surgical History  Procedure Laterality Date  . Shoulder surgery    . Knee arthroscopy     History   Social History  . Marital Status: Married    Spouse Name: N/A  . Number of Children: 55 y/o   Occupational History  . Sales - medical devices    Social History Main Topics  . Smoking status:  Never Smoker   . Smokeless tobacco: Not on file  . Alcohol Use: Yes     Comment: [place] 2 drinks twice a month   . Drug Use: No   Current Outpatient Prescriptions on File Prior to Visit  Medication Sig Dispense Refill  . atorvastatin (LIPITOR) 20 MG tablet Take 1 tablet (20 mg total) by mouth daily. 90 tablet 1  . glucagon (GLUCAGON EMERGENCY) 1 MG injection Inject 1 mg into the muscle once as needed. 2 each prn  . glucose blood (BAYER CONTOUR NEXT TEST) test strip Use as instructed 100 each 12  . glucose blood test strip 1 each by Other route 3 (three)  times daily. Use as instructed    . insulin aspart (NOVOLOG) 100 UNIT/ML injection Inject into the skin continuous. Insulin pump.  Administer boluses 3-4 times daily.    [name] lisdexamfetamine (VYVANSE) 20 MG capsule Take 20 mg by mouth daily as needed.    [name] lisdexamfetamine (VYVANSE) 40 MG capsule Take 40 mg by mouth every morning.    [name] lisinopril-hydrochlorothiazide (PRINZIDE,ZESTORETIC) 10-12.5 MG per tablet Take 1 tablet by mouth daily. 30 tablet 0  . sildenafil (VIAGRA) 50 MG tablet Take 1 tablet (50 mg total) by mouth daily as needed for erectile dysfunction. 10 tablet 0   No current facility-administered medications on file prior to visit.   Allergies  Allergen Reactions  . Penicillins Other (See Comments)    Pt does not remember last had as a baby   Family history: -  Brother died from complications of type 1 diabetes -  Mother and sister with hypertension  -  Sister with hyperlipidemia  -  Sister and father (deceased) with heart disease   PE: BP 134/90 mmHg  Pulse 103  Temp(Src) 98.2 F (36.8 C) (Oral)  Wt 264 lb (119.75 kg)  SpO2 96% Body mass index is 36.84 kg/(m^2). Wt Readings from Last 3 Encounters:  08/02/14 264 lb (119.75 kg)  07/31/14 262 lb (118.842 kg)  06/26/14 255 lb (115.667 kg)   Constitutional: overweight, in NAD Eyes: PERRLA, EOMI, no exophthalmos ENT: moist mucous membranes, no thyromegaly, no cervical lymphadenopathy Cardiovascular: RRR, No MRG Respiratory: CTA B Gastrointestinal: abdomen soft, NT, ND, BS+ Musculoskeletal: no deformities, strength intact in all 4 Skin: moist, warm, no rashes Neurological: no tremor with outstretched hands, DTR normal in all 4  ASSESSMENT: 1. DM1, uncontrolled, without complications  PLAN:  1. Patient with long-standing, uncontrolled DM1, on insulin pump therapy. Patient checks his sugars more often now compared to last visit.  I again explained the most important thing that he can do to improve his diabetes is  check his sugars before meals and bolus with every meal. He now does start the boluses 15 minutes before a meal. He has severe highs followed by severe lows, which he then overcorrects. Therefore, his sugars actually follow a sinusoid curve. We discussed about carb intake to correct a low blood sugar, and I will also increase his insulin sensitivity factor to avoid dramatic decreases after correction of a high. Because his sugars are still very high throughout the day, which prompts him to overcorrect him, I will increase the basal rates a little. I advised him to schedule an appointment with Leonia Reader with diabetes education about 2-3 weeks after this visit, and I will see him back in 5 weeks. - We discussed about changes to his insulin regimen, as follows:  Patient Instructions  Please change the pump settings as follows: -  basal  rates: 12 am: 2.0 units/h >> 2.1 - ICR:  12 am: 6 5 pm: 6 - target: 100-100 - ISF: 20 >> 30 - Insulin on Board: 4h - bolus wizard: on  Try to take only 15 g carbs for correction if sugars <70, except if in the 30s-40s, take 30 g. Wait 15 min before rechecking sugars to see if you need repeated correction.  Please schedule an appt with Vaughan Basta in 1 month.  Please come back for a follow-up appointment in 2 months.   - I again advised him to check sugars at different times of the day - check at least 4 times a day, rotating checks - advised for yearly eye exams he is up-to-date>>  - no signs of other autoimmune disorders, recent TSH normal - I also referred him to a primary care doctor within the [facility] the past - return to clinic in 2 months   - time spent with the patient: 40 min, of which >50% was spent in reviewing his pump downloads, discussing his hypo- and hyper-glycemic episodes, reviewing previous labs and pump settings and developing a plan to avoid hypo- and hyper-glycemia.

## 2014-08-03 ENCOUNTER — Telehealth: Payer: Self-pay | Admitting: Family

## 2014-08-03 NOTE — Telephone Encounter (Signed)
[facility] pages to Dr. Carver Filaalone

## 2014-08-22 ENCOUNTER — Other Ambulatory Visit (INDEPENDENT_AMBULATORY_CARE_PROVIDER_SITE_OTHER): Payer: BLUE CROSS/BLUE SHIELD

## 2014-08-22 DIAGNOSIS — N529 Male erectile dysfunction, unspecified: Secondary | ICD-10-CM | POA: Diagnosis not present

## 2014-08-22 DIAGNOSIS — IMO0002 Reserved for concepts with insufficient information to code with codable children: Secondary | ICD-10-CM

## 2014-08-22 DIAGNOSIS — I10 Essential (primary) hypertension: Secondary | ICD-10-CM | POA: Diagnosis not present

## 2014-08-22 DIAGNOSIS — E1065 Type 1 diabetes mellitus with hyperglycemia: Secondary | ICD-10-CM | POA: Diagnosis not present

## 2014-08-22 LAB — LIPID PANEL
Cholesterol: 110 mg/dL (ref 0–200)
HDL: 30 mg/dL — ABNORMAL LOW (ref >39.00)
LDL Cholesterol: 68 mg/dL (ref 0–99)
NONHDL: 80
Total CHOL/HDL Ratio: 4
Triglycerides: 59 mg/dL (ref 0.0–149.0)
VLDL: 11.8 mg/dL (ref 0.0–40.0)

## 2014-08-22 LAB — COMPREHENSIVE METABOLIC PANEL
ALBUMIN: 3.8 g/dL (ref 3.5–5.2)
ALK PHOS: 77 U/L (ref 39–117)
ALT: 17 U/L (ref 0–53)
AST: 14 U/L (ref 0–37)
BILIRUBIN TOTAL: 0.6 mg/dL (ref 0.2–1.2)
BUN: 22 mg/dL (ref 6–23)
CO2: 26 mEq/L (ref 19–32)
CREATININE: 1.36 mg/dL (ref 0.40–1.50)
Calcium: 8.6 mg/dL (ref 8.4–10.5)
Chloride: 101 mEq/L (ref 96–112)
GFR: 74.29 mL/min (ref >60.00)
Glucose, Bld: 272 mg/dL — ABNORMAL HIGH (ref 70–99)
Potassium: 4.1 mEq/L (ref 3.5–5.1)
Sodium: 135 mEq/L (ref 135–145)
TOTAL PROTEIN: 6.6 g/dL (ref 6.0–8.3)

## 2014-08-22 LAB — TESTOSTERONE: TESTOSTERONE: 559.44 ng/dL (ref 300.00–890.00)

## 2014-08-24 ENCOUNTER — Encounter: Payer: Self-pay | Admitting: Family

## 2014-08-24 ENCOUNTER — Ambulatory Visit (INDEPENDENT_AMBULATORY_CARE_PROVIDER_SITE_OTHER): Payer: BLUE CROSS/BLUE SHIELD | Admitting: Family

## 2014-08-24 VITALS — BP 134/82 | HR 102 | Temp 98.7°F | Resp 18 | Ht 71.0 in | Wt 255.4 lb

## 2014-08-24 DIAGNOSIS — I10 Essential (primary) hypertension: Secondary | ICD-10-CM | POA: Diagnosis not present

## 2014-08-24 DIAGNOSIS — N529 Male erectile dysfunction, unspecified: Secondary | ICD-10-CM

## 2014-08-24 MED ORDER — LISINOPRIL-HYDROCHLOROTHIAZIDE 10-12.5 MG PO TABS: 1.0000 | ORAL_TABLET | Freq: Every day | ORAL | Status: DC

## 2014-08-24 MED ORDER — SILDENAFIL CITRATE 20 MG PO TABS: ORAL_TABLET | ORAL | Status: DC

## 2014-08-24 MED ORDER — SILDENAFIL CITRATE 100 MG PO TABS: 100.0000 mg | ORAL_TABLET | Freq: Every day | ORAL | Status: DC | PRN

## 2014-08-24 NOTE — Assessment & Plan Note (Signed)
Stable with current regimen, however would like to increase. Increase sildenafil to 100 mg daily as needed. Follow-up if symptoms worsen or fail to improve.

## 2014-08-24 NOTE — Progress Notes (Signed)
Subjective:    Patient ID: Johnny Hicks, male    DOB: 07-Feb-1976, 39 y.o.   MRN: [id number]  Chief Complaint  Patient presents with  . Follow-up    HPI:  Johnny Hicks is a 39 y.o. male with a PMH of hypertension and erectile dysfunction who presents today for a follow up office visit.   1.) Hypertension - currently maintained lisinopril-hydrochlorothiazide and notes that his blood pressure has been improved. Takes his medication as prescribed and denies adverse side effects of the medication.   BP Readings from Last 3 Encounters:  08/24/14 134/82  08/02/14 134/90  07/31/14 150/104    2.) Erectile dysfunction - Currently  Maintained on 50 mg of Viagra. Takes her medication as prescribed and denies adverse side effects. Does note however, that the medication does not  Have the significant effect like he hoped it would have. Would like to increase medication if possible.   Allergies  Allergen Reactions  . Penicillins Other (See Comments)    Pt does not remember last had as a baby    Current Outpatient Prescriptions on File Prior to Visit  Medication Sig Dispense Refill  . atorvastatin (LIPITOR) 20 MG tablet Take 1 tablet (20 mg total) by mouth daily. 90 tablet 1  . glucagon (GLUCAGON EMERGENCY) 1 MG injection Inject 1 mg into the muscle once as needed. 2 each prn  . glucose blood (BAYER CONTOUR NEXT TEST) test strip Use as instructed 100 each 12  . glucose blood test strip 1 each by Other route 3 (three) times daily. Use as instructed    . insulin aspart (NOVOLOG) 100 UNIT/ML injection Inject into the skin continuous. Insulin pump.  Administer boluses 3-4 times daily.    [name] lisdexamfetamine (VYVANSE) 20 MG capsule Take 20 mg by mouth daily as needed.    [name] lisdexamfetamine (VYVANSE) 40 MG capsule Take 40 mg by mouth every morning.     No current facility-administered medications on file prior to visit.      Review of Systems  Constitutional: Negative for fever and  chills.  Eyes:       Negative for change in vision.   Respiratory: Negative for chest tightness and shortness of breath.   Cardiovascular: Negative for chest pain, palpitations and leg swelling.  Neurological: Negative for headaches.      Objective:    BP 134/82 mmHg  Pulse 102  Temp(Src) 98.7 F (37.1 C) (Oral)  Resp 18  Ht  (1.803 m)  Wt 255 lb 6.4 oz (115.849 kg)  BMI 35.64 kg/m2  SpO2 96% Nursing note and vital signs reviewed.  Physical Exam  Constitutional: He is oriented to person, place, and time. He appears well-developed and well-nourished. No distress.  Cardiovascular: Normal rate, regular rhythm, normal heart sounds and intact distal pulses.   Pulmonary/Chest: Effort normal and breath sounds normal.  Neurological: He is alert and oriented to person, place, and time.  Skin: Skin is warm and dry.  Psychiatric: He has a normal mood and affect. His behavior is normal. Judgment and thought content normal.       Assessment & Plan:   Problem List Items Addressed This Visit      Cardiovascular and Mediastinum   Essential hypertension     Blood pressure remains well controlled with current regimen of lisinopril-hydrochlorothiazide. Continue current dosage of lisinopril hydrochlorothiazide. Continue to monitor blood pressure at home. Follow-up in 6 months.      Relevant Medications   sildenafil (VIAGRA)  100 MG tablet   lisinopril-hydrochlorothiazide (PRINZIDE,ZESTORETIC) 10-12.5 MG per tablet   sildenafil (REVATIO) 20 MG tablet     Genitourinary   Erectile dysfunction - Primary     Stable with current regimen, however would like to increase. Increase sildenafil to 100 mg daily as needed. Follow-up if symptoms worsen or fail to improve.      Relevant Medications   sildenafil (VIAGRA) 100 MG tablet   sildenafil (REVATIO) 20 MG tablet

## 2014-08-24 NOTE — Patient Instructions (Signed)
Thank you for choosing  HealthCare.  Summary/Instructions:  Your prescription(s) have been submitted to your pharmacy or been printed and provided for you. Please take as directed and contact our office if you believe you are having problem(s) with the medication(s) or have any questions.  If your symptoms worsen or fail to improve, please contact our office for further instruction, or in case of emergency go directly to the emergency room at the closest medical facility.     

## 2014-08-24 NOTE — Assessment & Plan Note (Signed)
Blood pressure remains well controlled with current regimen of lisinopril-hydrochlorothiazide. Continue current dosage of lisinopril hydrochlorothiazide. Continue to monitor blood pressure at home. Follow-up in 6 months.

## 2014-08-24 NOTE — Progress Notes (Signed)
Pre visit review using our clinic review tool, if applicable. No additional management support is needed unless otherwise documented below in the visit note. 

## 2014-09-18 ENCOUNTER — Ambulatory Visit (INDEPENDENT_AMBULATORY_CARE_PROVIDER_SITE_OTHER): Payer: 59 | Admitting: Family

## 2014-09-18 ENCOUNTER — Encounter: Payer: Self-pay | Admitting: Family

## 2014-09-18 ENCOUNTER — Other Ambulatory Visit: Payer: Self-pay | Admitting: *Deleted

## 2014-09-18 VITALS — BP 142/82 | HR 105 | Temp 98.1°F | Resp 16 | Ht 71.0 in | Wt 261.0 lb

## 2014-09-18 DIAGNOSIS — J019 Acute sinusitis, unspecified: Secondary | ICD-10-CM | POA: Diagnosis not present

## 2014-09-18 DIAGNOSIS — E1065 Type 1 diabetes mellitus with hyperglycemia: Secondary | ICD-10-CM

## 2014-09-18 DIAGNOSIS — IMO0002 Reserved for concepts with insufficient information to code with codable children: Secondary | ICD-10-CM

## 2014-09-18 MED ORDER — GLUCOSE BLOOD VI STRP: ORAL_STRIP | Status: DC

## 2014-09-18 MED ORDER — INSULIN ASPART 100 UNIT/ML ~~LOC~~ SOLN: SUBCUTANEOUS | Status: DC

## 2014-09-18 MED ORDER — DOXYCYCLINE HYCLATE 100 MG PO TABS: 100.0000 mg | ORAL_TABLET | Freq: Two times a day (BID) | ORAL | Status: DC

## 2014-09-18 MED ORDER — INSULIN ASPART 100 UNIT/ML FLEXPEN: 50.0000 [IU] | PEN_INJECTOR | Freq: Every day | SUBCUTANEOUS | Status: DC

## 2014-09-18 MED ORDER — HYDROCODONE-HOMATROPINE 5-1.5 MG/5ML PO SYRP: 5.0000 mL | ORAL_SOLUTION | Freq: Three times a day (TID) | ORAL | Status: DC | PRN

## 2014-09-18 NOTE — Patient Instructions (Addendum)
Thank you for choosing [facility].  Summary/Instructions:  Your prescription(s) have been submitted to your pharmacy or been printed and provided for you. Please take as directed and contact our office if you believe you are having problem(s) with the medication(s) or have any questions.  If your symptoms worsen or fail to improve, please contact our office for further instruction, or in case of emergency go directly to the emergency room at the closest medical facility.   General Recommendations:    Please drink plenty of fluids.  Get plenty of rest   Sleep in humidified air  Use saline nasal sprays  Netti pot   OTC Medications:  Decongestants - helps relieve congestion   Flonase (generic fluticasone) or Nasacort (generic triamcinolone) - please make sure to use the "cross-over" technique at a 45 degree angle towards the opposite eye as opposed to straight up the nasal passageway.   Sudafed (generic pseudoephedrine - Note this is the one that is available behind the pharmacy counter); Products with phenylephrine (-PE) may also be used but is often not as effective as pseudoephedrine.   If you have HIGH BLOOD PRESSURE - Coricidin HBP; AVOID any product that is -D as this contains pseudoephedrine which may increase your blood pressure.  Afrin (oxymetazoline) every 6-8 hours for up to 3 days.   Allergies - helps relieve runny nose, itchy eyes and sneezing   Claritin (generic loratidine), Allegra (fexofenidine), or Zyrtec (generic cyrterizine) for runny nose. These medications should not cause drowsiness.  Note - Benadryl (generic diphenhydramine) may be used however may cause drowsiness  Cough -   Delsym or Robitussin (generic dextromethorphan)  Expectorants - helps loosen mucus to ease removal   Mucinex (generic guaifenesin) as directed on the package.  Headaches / General Aches   Tylenol (generic acetaminophen) - DO NOT EXCEED 3 grams (3,000 mg) in a 24  hour time period  Advil/Motrin (generic ibuprofen)   Sore Throat -   Salt water gargle   Chloraseptic (generic benzocaine) spray or lozenges / Sucrets (generic dyclonine)    Sinusitis Sinusitis is redness, soreness, and inflammation of the paranasal sinuses. Paranasal sinuses are air pockets within the bones of your face (beneath the eyes, the middle of the forehead, or above the eyes). In healthy paranasal sinuses, mucus is able to drain out, and air is able to circulate through them by way of your nose. However, when your paranasal sinuses are inflamed, mucus and air can become trapped. This can allow bacteria and other germs to grow and cause infection. Sinusitis can develop quickly and last only a short time (acute) or continue over a long period (chronic). Sinusitis that lasts for more than 12 weeks is considered chronic.  CAUSES  Causes of sinusitis include:  Allergies.  Structural abnormalities, such as displacement of the cartilage that separates your nostrils (deviated septum), which can decrease the air flow through your nose and sinuses and affect sinus drainage.  Functional abnormalities, such as when the small hairs (cilia) that line your sinuses and help remove mucus do not work properly or are not present. SIGNS AND SYMPTOMS  Symptoms of acute and chronic sinusitis are the same. The primary symptoms are pain and pressure around the affected sinuses. Other symptoms include:  Upper toothache.  Earache.  Headache.  Bad breath.  Decreased sense of smell and taste.  A cough, which worsens when you are lying flat.  Fatigue.  Fever.  Thick drainage from your nose, which often is green and may   contain pus (purulent).  Swelling and warmth over the affected sinuses. DIAGNOSIS  Your health care provider will perform a physical exam. During the exam, your health care provider may:  Look in your nose for signs of abnormal growths in your nostrils (nasal  polyps).  Tap over the affected sinus to check for signs of infection.  View the inside of your sinuses (endoscopy) using an imaging device that has a light attached (endoscope). If your health care provider suspects that you have chronic sinusitis, one or more of the following tests may be recommended:  Allergy tests.  Nasal culture. A sample of mucus is taken from your nose, sent to a lab, and screened for bacteria.  Nasal cytology. A sample of mucus is taken from your nose and examined by your health care provider to determine if your sinusitis is related to an allergy. TREATMENT  Most cases of acute sinusitis are related to a viral infection and will resolve on their own within 10 days. Sometimes medicines are prescribed to help relieve symptoms (pain medicine, decongestants, nasal steroid sprays, or saline sprays).  However, for sinusitis related to a bacterial infection, your health care provider will prescribe antibiotic medicines. These are medicines that will help kill the bacteria causing the infection.  Rarely, sinusitis is caused by a fungal infection. In theses cases, your health care provider will prescribe antifungal medicine. For some cases of chronic sinusitis, surgery is needed. Generally, these are cases in which sinusitis recurs more than 3 times per year, despite other treatments. HOME CARE INSTRUCTIONS   Drink plenty of water. Water helps thin the mucus so your sinuses can drain more easily.  Use a humidifier.  Inhale steam 3 to 4 times a day (for example, sit in the bathroom with the shower running).  Apply a warm, moist washcloth to your face 3 to 4 times a day, or as directed by your health care provider.  Use saline nasal sprays to help moisten and clean your sinuses.  Take medicines only as directed by your health care provider.  If you were prescribed either an antibiotic or antifungal medicine, finish it all even if you start to feel better. SEEK IMMEDIATE  MEDICAL CARE IF:  You have increasing pain or severe headaches.  You have nausea, vomiting, or drowsiness.  You have swelling around your face.  You have vision problems.  You have a stiff neck.  You have difficulty breathing. MAKE SURE YOU:   Understand these instructions.  Will watch your condition.  Will get help right away if you are not doing well or get worse. Document Released: 02/16/2005 Document Revised: 07/03/2013 Document Reviewed: 03/03/2011 Exit[employer] Patient Information 2015 [employer]. This information is not intended to replace advice given to you by your health care provider. Make sure you discuss any questions you have with your health care provider.   

## 2014-09-18 NOTE — Telephone Encounter (Signed)
Patient is requesting a refill of his novolog vial and (novolog pen for emergencies) And his contour next strips testing 3-4 times a day [facility] [address] [place].

## 2014-09-18 NOTE — Telephone Encounter (Signed)
Based on his TDD of insulin, he will need 3 vials of insulin a month >> please change the amount to be sent. He will need up to 50 units bolus a day based on how much he is using now in the pump.

## 2014-09-18 NOTE — Telephone Encounter (Signed)
Testing strips ordered. Please advise on units of insulin daily and for the novolog pens. Thank you.

## 2014-09-18 NOTE — Progress Notes (Signed)
Subjective:    Patient ID: Johnny Hicks, male    DOB: 05/15/1975, 39 y.o.   MRN: [id number]  Chief Complaint  Patient presents with  . Cough    says the last couple days he hasn't felt well says that he feels like he has alot of fluid in his head, productive cough, drainage    HPI:  Johnny Hicks is a 39 y.o. male with a PMH of hypertension, type 1 diabetes and erectile dysfunction who presents today for an acute office visit.      Associated symptom of productive cough, drainage, and fluid in his head has been going on for a 6 days. Modifying factors include Robutussin, dayquil and nyquil which have provided minimal relief. Timing of symptoms is worse in the evening. Denies recent antibiotic use.   Allergies  Allergen Reactions  . Penicillins Other (See Comments)    Pt does not remember last had as a baby    Current Outpatient Prescriptions on File Prior to Visit  Medication Sig Dispense Refill  . atorvastatin (LIPITOR) 20 MG tablet Take 1 tablet (20 mg total) by mouth daily. 90 tablet 1  . glucagon (GLUCAGON EMERGENCY) 1 MG injection Inject 1 mg into the muscle once as needed. 2 each prn  . glucose blood test strip 1 each by Other route 3 (three) times daily. Use as instructed    . lisdexamfetamine (VYVANSE) 20 MG capsule Take 20 mg by mouth daily as needed.    [name] lisdexamfetamine (VYVANSE) 40 MG capsule Take 40 mg by mouth every morning.    [name] lisinopril-hydrochlorothiazide (PRINZIDE,ZESTORETIC) 10-12.5 MG per tablet Take 1 tablet by mouth daily. 90 tablet 1  . sildenafil (REVATIO) 20 MG tablet Take 5 tablets daily as needed. 50 tablet 0  . sildenafil (VIAGRA) 100 MG tablet Take 1 tablet (100 mg total) by mouth daily as needed for erectile dysfunction. 30 tablet 0   No current facility-administered medications on file prior to visit.    Review of Systems  Constitutional: Negative for fever and chills.  HENT: Positive for congestion, sinus pressure and sore throat.  Negative for ear pain.   Respiratory: Positive for cough. Negative for chest tightness and shortness of breath.   Neurological: Positive for headaches.      Objective:    BP 142/82 mmHg  Pulse 105  Temp(Src) 98.1 F (36.7 C) (Oral)  Resp 16  Ht  (1.803 m)  Wt 261 lb (118.389 kg)  BMI 36.42 kg/m2  SpO2 97% Nursing note and vital signs reviewed.  Physical Exam  Constitutional: He is oriented to person, place, and time. He appears well-developed and well-nourished. No distress.  HENT:  Right Ear: Hearing, tympanic membrane, external ear and ear canal normal.  Left Ear: Hearing, tympanic membrane, external ear and ear canal normal.  Nose: Right sinus exhibits maxillary sinus tenderness and frontal sinus tenderness. Left sinus exhibits maxillary sinus tenderness and frontal sinus tenderness.  Mouth/Throat: Uvula is midline, oropharynx is clear and moist and mucous membranes are normal.  Cardiovascular: Normal rate, regular rhythm, normal heart sounds and intact distal pulses.   Pulmonary/Chest: Effort normal and breath sounds normal.  Neurological: He is alert and oriented to person, place, and time.  Skin: Skin is warm and dry.  Psychiatric: He has a normal mood and affect. His behavior is normal. Judgment and thought content normal.       Assessment & Plan:   Problem List Items Addressed This Visit  Respiratory   Sinusitis, acute - Primary     Symptoms and exam consistent with sinusitis. Start doxycycline. Start Hycodan as needed for cough and sleep. Continue over-the-counter medications as needed for symptom relief and supportive care. Follow up if symptoms worsen or fail to improve.      Relevant Medications   HYDROcodone-homatropine (HYCODAN) 5-1.5 MG/5ML syrup   doxycycline (VIBRA-TABS) 100 MG tablet

## 2014-09-18 NOTE — Assessment & Plan Note (Signed)
Symptoms and exam consistent with sinusitis. Start doxycycline. Start Hycodan as needed for cough and sleep. Continue over-the-counter medications as needed for symptom relief and supportive care. Follow up if symptoms worsen or fail to improve.

## 2014-09-18 NOTE — Progress Notes (Signed)
Pre visit review using our clinic review tool, if applicable. No additional management support is needed unless otherwise documented below in the visit note. 

## 2014-09-21 ENCOUNTER — Other Ambulatory Visit: Payer: Self-pay | Admitting: *Deleted

## 2014-09-21 LAB — HM DIABETES EYE EXAM

## 2014-09-21 MED ORDER — INSULIN LISPRO 100 UNIT/ML (KWIKPEN): 50.0000 [IU] | PEN_INJECTOR | Freq: Every day | SUBCUTANEOUS | Status: DC

## 2014-09-26 ENCOUNTER — Telehealth: Payer: Self-pay | Admitting: Internal Medicine

## 2014-09-26 NOTE — Telephone Encounter (Signed)
Called pt and advised him that his ins co denied coverage for Novolog, they prefer Humalog. We switched due to ins.

## 2014-09-26 NOTE — Telephone Encounter (Signed)
Please call asap regarding the change of Humalog from Novolog

## 2015-01-14 ENCOUNTER — Other Ambulatory Visit: Payer: Self-pay | Admitting: Family

## 2015-01-20 ENCOUNTER — Other Ambulatory Visit: Payer: Self-pay | Admitting: Family

## 2015-02-06 ENCOUNTER — Ambulatory Visit: Payer: 59 | Admitting: Internal Medicine

## 2015-02-08 ENCOUNTER — Ambulatory Visit (INDEPENDENT_AMBULATORY_CARE_PROVIDER_SITE_OTHER): Payer: 59 | Admitting: Family

## 2015-02-08 ENCOUNTER — Encounter: Payer: Self-pay | Admitting: Family

## 2015-02-08 VITALS — BP 120/88 | HR 112 | Temp 98.0°F | Resp 18 | Ht 71.0 in | Wt 268.8 lb

## 2015-02-08 DIAGNOSIS — I10 Essential (primary) hypertension: Secondary | ICD-10-CM | POA: Diagnosis not present

## 2015-02-08 MED ORDER — LISINOPRIL-HYDROCHLOROTHIAZIDE 20-25 MG PO TABS: 1.0000 | ORAL_TABLET | Freq: Every day | ORAL | Status: DC

## 2015-02-08 NOTE — Progress Notes (Signed)
Pre visit review using our clinic review tool, if applicable. No additional management support is needed unless otherwise documented below in the visit note. 

## 2015-02-08 NOTE — Progress Notes (Signed)
Subjective:    Patient ID: Johnny Hicks, male    DOB: 1975/11/07, 39 y.o.   MRN: [id number]  Chief Complaint  Patient presents with  . Hypertension    HPI:  Johnny Hicks is a 39 y.o. male who  has a past medical history of Hypertension; Diabetes mellitus without complication (HCC); and ADHD (attention deficit hyperactivity disorder). and presents today an office follow up.   Hypertension is currently maintained with lisinopril-hydrochlorothiazide. Takes medication as prescribed and denies adverse side effects or hypertensive readings. Blood pressure reading has been slightly increased during his recent visits to the attention specialists who wanted him to have it checked.   BP Readings from Last 3 Encounters:  02/08/15 120/88  09/18/14 142/82  08/24/14 134/82   Allergies  Allergen Reactions  . Penicillins Other (See Comments)    Pt does not remember last had as a baby     Current Outpatient Prescriptions on File Prior to Visit  Medication Sig Dispense Refill  . atorvastatin (LIPITOR) 20 MG tablet TAKE 1 TABLET (20 MG TOTAL) BY MOUTH DAILY. 90 tablet 1  . glucagon (GLUCAGON EMERGENCY) 1 MG injection Inject 1 mg into the muscle once as needed. 2 each prn  . glucose blood (BAYER CONTOUR NEXT TEST) test strip Use to test blood sugar 4 times daily as instructed. Dx: E10.65 125 each 11  . glucose blood test strip 1 each by Other route 3 (three) times daily. Use as instructed    . insulin aspart (NOVOLOG FLEXPEN) 100 UNIT/ML FlexPen Inject 50 Units into the skin daily. 15 mL 0  . insulin aspart (NOVOLOG) 100 UNIT/ML injection Use up to 50 units of insulin daily via insulin pump. Administer boluses 3-4 times daily. 30 mL 2  . insulin lispro (HUMALOG KWIKPEN) 100 UNIT/ML KiwkPen Inject 0.5 mLs (50 Units total) into the skin daily. 15 mL 2  . lisdexamfetamine (VYVANSE) 20 MG capsule Take 20 mg by mouth daily as needed.    [name] lisdexamfetamine (VYVANSE) 40 MG capsule Take 40 mg by  mouth every morning.     No current facility-administered medications on file prior to visit.    Review of Systems  Eyes:       Negative for changes in vision.  Respiratory: Negative for chest tightness and shortness of breath.   Cardiovascular: Negative for chest pain, palpitations and leg swelling.  Neurological: Negative for headaches.      Objective:    BP 120/88 mmHg  Pulse 112  Temp(Src) 98 F (36.7 C) (Oral)  Resp 18  Ht  (1.803 m)  Wt 268 lb 12.8 oz (121.927 kg)  BMI 37.51 kg/m2  SpO2 96% Nursing note and vital signs reviewed.  Physical Exam  Constitutional: He is oriented to person, place, and time. He appears well-developed and well-nourished. No distress.  Cardiovascular: Normal rate, regular rhythm, normal heart sounds and intact distal pulses.   Pulmonary/Chest: Effort normal and breath sounds normal.  Neurological: He is alert and oriented to person, place, and time.  Skin: Skin is warm and dry.  Psychiatric: He has a normal mood and affect. His behavior is normal. Judgment and thought content normal.       Assessment & Plan:   Problem List Items Addressed This Visit      Cardiovascular and Mediastinum   Essential hypertension - Primary    Blood pressure is appears well controlled with current regimen with occasional elevations. Increase lisinopril-hydrochlorothiazide. This is complicated by his use of  Vyvanse. Continue to monitor blood pressure at home. Follow up with blood pressure readings in the next 3 weeks.       Relevant Medications   lisinopril-hydrochlorothiazide (PRINZIDE,ZESTORETIC) 20-25 MG tablet

## 2015-02-08 NOTE — Assessment & Plan Note (Addendum)
Blood pressure is appears well controlled with current regimen with occasional elevations. Increase lisinopril-hydrochlorothiazide. This is complicated by his use of Vyvanse. Continue to monitor blood pressure at home. Follow up with blood pressure readings in the next 3 weeks.

## 2015-02-08 NOTE — Patient Instructions (Signed)
Thank you for choosing [facility].  Summary/Instructions:  Your prescription(s) have been submitted to your pharmacy or been printed and provided for you. Please take as directed and contact our office if you believe you are having problem(s) with the medication(s) or have any questions.  If your symptoms worsen or fail to improve, please contact our office for further instruction, or in case of emergency go directly to the emergency room at the closest medical facility.   Continue to monitor your blood pressure at home.

## 2015-02-11 ENCOUNTER — Other Ambulatory Visit: Payer: Self-pay | Admitting: Internal Medicine

## 2015-02-20 ENCOUNTER — Ambulatory Visit: Payer: BLUE CROSS/BLUE SHIELD | Admitting: Family

## 2015-02-22 ENCOUNTER — Ambulatory Visit: Payer: 59 | Admitting: Internal Medicine

## 2015-02-27 ENCOUNTER — Telehealth: Payer: Self-pay | Admitting: Internal Medicine

## 2015-02-27 NOTE — Telephone Encounter (Signed)
If the switch was made due to insurance preference may need prior authorization, deferred to Dr. GReece Agar

## 2015-02-27 NOTE — Telephone Encounter (Signed)
We had to switch pt to humalog and it is not working as well, his blood sugars are not regulating. Pt was originally on novolog, please advise.

## 2015-02-27 NOTE — Telephone Encounter (Signed)
Please see below.

## 2015-02-27 NOTE — Telephone Encounter (Signed)
Can you advise for Dr. Lafe GarinGherge, or send to her?

## 2015-03-06 NOTE — Telephone Encounter (Signed)
Let's start a PA for Novolog.

## 2015-03-12 NOTE — Telephone Encounter (Signed)
PA has been sent to Cover my Meds today.

## 2015-03-20 ENCOUNTER — Other Ambulatory Visit: Payer: Self-pay | Admitting: *Deleted

## 2015-03-20 MED ORDER — INSULIN ASPART 100 UNIT/ML ~~LOC~~ SOLN: 50.0000 [IU] | Freq: Every day | SUBCUTANEOUS | Status: DC

## 2015-03-20 MED ORDER — INSULIN ASPART 100 UNIT/ML FLEXPEN: 50.0000 [IU] | PEN_INJECTOR | Freq: Every day | SUBCUTANEOUS | Status: DC

## 2015-03-20 NOTE — Telephone Encounter (Signed)
Pt needs vials not pens. Resending.

## 2015-04-01 ENCOUNTER — Ambulatory Visit (INDEPENDENT_AMBULATORY_CARE_PROVIDER_SITE_OTHER): Payer: 59 | Admitting: Internal Medicine

## 2015-04-01 ENCOUNTER — Encounter: Payer: Self-pay | Admitting: Internal Medicine

## 2015-04-01 ENCOUNTER — Other Ambulatory Visit (INDEPENDENT_AMBULATORY_CARE_PROVIDER_SITE_OTHER): Payer: 59 | Admitting: *Deleted

## 2015-04-01 VITALS — BP 122/80 | HR 90 | Temp 97.4°F | Resp 12 | Wt 268.8 lb

## 2015-04-01 DIAGNOSIS — E1065 Type 1 diabetes mellitus with hyperglycemia: Secondary | ICD-10-CM

## 2015-04-01 DIAGNOSIS — IMO0002 Reserved for concepts with insufficient information to code with codable children: Secondary | ICD-10-CM | POA: Insufficient documentation

## 2015-04-01 DIAGNOSIS — E103299 Type 1 diabetes mellitus with mild nonproliferative diabetic retinopathy without macular edema, unspecified eye: Secondary | ICD-10-CM

## 2015-04-01 LAB — POCT GLYCOSYLATED HEMOGLOBIN (HGB A1C): Hemoglobin A1C: 11.3

## 2015-04-01 NOTE — Progress Notes (Signed)
Patient ID: Johnny Hicks, male   DOB: Feb 14, 1976, 40 y.o.   MRN: [id number]  HPI: Johnny Hicks is a 40 y.o.-year-old male, returning for f/u for DM1, dx 2001, uncontrolled, without complications. Last visit 7 mo ago.  Last hemoglobin A1c was: Lab Results  Component Value Date   HGBA1C 10.2* 06/26/2014  11/2013: HbA1c in the 9's 04/08/2012: GAD Ab 4.8 (<1.0)  Pt is on an insulin pump: Paradigm Revel 723 (Medtronic) - 40 years old, without a CGM (is not interested in one), uses Novolog in the pump.  Pump settings: - basal rates: -  basal rates: 12 am: 2.0 units/h >> 2.1 - ICR:  12 am: 6 5 pm: 6 - target: 100-100 - ISF: 20 >> 30 - Insulin on Board: 4h - bolus wizard: on TDD from basal insulin: 50.1 units (62%) TDD from bolus insulin: 47.3 units (38%)! - extended bolusing: not using - changes infusion site: q3-5 days - Meter: Bayer Contour Link  Pt checks his sugars 0.8 x a day and they are - ave 341 +/- 151 >> 243 +/- 138 >> 288 +/- 122: Now:  Last visit:   - am: 58-370 >> <40 x1, 155-189 >> 77, 145-224, 398 - 2h after b'fast: 205-370 >> 238->400 >> 299 - before lunch: 205>400 >> 53-314 >> 233-381 - 2h after lunch: 47-400 >> 57-232 >> >400 - before dinner: 192->400 >> 49-333 >> 329->400 - 2h after dinner: 197->400 >> 56-308 >> 317-320 - bedtime: see above >> 238->400 >> n/c - nighttime: >400 >> <40->400 >> 312 No lows. Lowest sugar was <40 (usually his low CBGs happen after correction of highs); he has hypoglycemia awareness at 60-70. No previous hypoglycemia admission. Has a glucagon kit at home. Highest sugar was Hi. No previous DKA admissions.    Pt's meals are: - Breakfast: usually skips - Brunch (10-11 am): meat + starch + veggies (60g carbs) - Dinner (4-5 pm or later if busy at work): ? - Snacks: usually not; but recently had some at night: chips, etc.  He was previously on a restricted diet at the beginning of the year and he started to have low CBGs,  so he had to adjust his pump settings then. He walks or plays basketball 3-5 times a week.  - no CKD, last BUN/creatinine:  Lab Results  Component Value Date   BUN 22 08/22/2014   CREATININE 1.36 08/22/2014  On Lisinopril. - Has HL Lab Results  Component Value Date   CHOL 110 08/22/2014   HDL 30.00* 08/22/2014   LDLCALC 68 08/22/2014   TRIG 59.0 08/22/2014   CHOLHDL 4 08/22/2014   He was on Lipitor 20. Now off for 3 months.  - last eye exam was in 08/2014. + DR.  - no numbness and tingling in his feet.  Diabetic Foot Exam - Simple   Simple Foot Form  Diabetic Foot exam was performed with the following findings:  Yes 04/01/2015  4:37 PM  Visual Inspection  See comments:  Yes  Sensation Testing  Intact to touch and monofilament testing bilaterally:  Yes  Pulse Check  Posterior Tibialis and Dorsalis pulse intact bilaterally:  Yes  Comments  Flat feet B     - + decreased erections  Lab Results  Component Value Date   TSH 0.99 06/26/2014    ROS: Constitutional: no weight gain/loss, no fatigue, no subjective hyperthermia/hypothermia Eyes: no blurry vision, no xerophthalmia ENT: no sore throat, no nodules palpated in throat, no dysphagia/odynophagia, no  hoarseness Cardiovascular: no CP/SOB/palpitations/leg swelling Respiratory: no cough/SOB Gastrointestinal: no N/V/D/C Musculoskeletal: no muscle/joint aches Skin: no rashes Neurological: no tremors/numbness/tingling/dizziness Psychiatric: no depression/anxiety + Low libido, problems with erections  Foot exam performed today.  Past Medical History  Diagnosis Date  . Hypertension   . Diabetes mellitus without complication (HCC)     Type 1  . ADHD (attention deficit hyperactivity disorder)    Past Surgical History  Procedure Laterality Date  . Shoulder surgery    . Knee arthroscopy     History   Social History  . Marital Status: Married    Spouse Name: N/A  . Number of Children: 3 y/o   Occupational  History  . Sales - medical devices    Social History Main Topics  . Smoking status: Never Smoker   . Smokeless tobacco: Not on file  . Alcohol Use: Yes     Comment: Bourbon 2 drinks twice a month   . Drug Use: No   Current Outpatient Prescriptions on File Prior to Visit  Medication Sig Dispense Refill  . atorvastatin (LIPITOR) 20 MG tablet TAKE 1 TABLET (20 MG TOTAL) BY MOUTH DAILY. 90 tablet 1  . glucagon (GLUCAGON EMERGENCY) 1 MG injection Inject 1 mg into the muscle once as needed. 2 each prn  . glucose blood (BAYER CONTOUR NEXT TEST) test strip Use to test blood sugar 4 times daily as instructed. Dx: E10.65 125 each 11  . glucose blood test strip 1 each by Other route 3 (three) times daily. Use as instructed    . insulin aspart (NOVOLOG) 100 UNIT/ML injection Inject 50 Units into the skin daily. 10 mL 0  . insulin lispro (HUMALOG KWIKPEN) 100 UNIT/ML KiwkPen Inject 0.5 mLs (50 Units total) into the skin daily. **PT NEEDS A FOLLOW UP APPT** 15 pen 0  . lisdexamfetamine (VYVANSE) 20 MG capsule Take 20 mg by mouth daily as needed.    [name] lisdexamfetamine (VYVANSE) 40 MG capsule Take 40 mg by mouth every morning.    [name] lisinopril-hydrochlorothiazide (PRINZIDE,ZESTORETIC) 20-25 MG tablet Take 1 tablet by mouth daily. 90 tablet 0   No current facility-administered medications on file prior to visit.   Allergies  Allergen Reactions  . Penicillins Other (See Comments)    Pt does not remember last had as a baby   Family history: -  Brother died from complications of type 1 diabetes -  Mother and sister with hypertension  -  Sister with hyperlipidemia  -  Sister and father (deceased) with heart disease   PE: BP 122/80 mmHg  Pulse 90  Temp(Src) 97.4 F (36.3 C) (Oral)  Resp 12  Wt 268 lb 12.8 oz (121.927 kg)  SpO2 97% Body mass index is 37.51 kg/(m^2). Wt Readings from Last 3 Encounters:  04/01/15 268 lb 12.8 oz (121.927 kg)  02/08/15 268 lb 12.8 oz (121.927 kg)  09/18/14 261 lb  (118.389 kg)   Constitutional: overweight, in NAD Eyes: PERRLA, EOMI, no exophthalmos ENT: moist mucous membranes, no thyromegaly, no cervical lymphadenopathy Cardiovascular: RRR, No MRG Respiratory: CTA B Gastrointestinal: abdomen soft, NT, ND, BS+ Musculoskeletal: no deformities, strength intact in all 4 Skin: moist, warm, no rashes Neurological: no tremor with outstretched hands, DTR normal in all 4  ASSESSMENT: 1. DM1, uncontrolled, without complications  PLAN:  1. Patient with long-standing, uncontrolled DM1, on insulin pump therapy. Patient checks his sugars seldom as he is busy at work >> we discussed about a CGM >> will call Medtronic rep to see if  he can upgrade to the 630G pump + Enlite, as an intermediary towards the 670G (artificial pancreas). - as sugars are high throughout the day, especially after meals >> will decrease ICR (as of now, he has to add 25g carbs to the calculated g carbs per meal to get more insulin).  - will not increase basal rates as he gets 62% TDD insulin from basal insulin and only 38% from boluses.  - advised him to enter all carbs in the pump - he is doing a great job changing his site q3-4 days and using only bolus wizard boluses - We discussed about changes to his insulin regimen, as follows:  Patient Instructions  Please  - basal rates: 12 am: 2.1 units/h  - ICR:  12 am: 6 >> 4.5 5 pm: 6 >> 4.5  - target: 100-100 - ISF: 30 - Insulin on Board: 4h - bolus wizard: on  Please talk to Franquez about upgrading your pump.   Please return in 3 months with your sugar log.   - I again advised him to check sugars at different times of the day - check at least 4 times a day, rotating checks - advised for yearly eye exams >> he is up-to-date - no signs of other autoimmune disorders - checked HbA1c today: 11.3% (terrible) - return to clinic in 3 months   - time spent with the patient: 40 min, of which >50% was spent in reviewing his pump  downloads, discussing his hypo- and hyper-glycemic episodes, reviewing previous labs and pump settings and developing a plan to avoid hypo- and hyper-glycemia.

## 2015-04-01 NOTE — Patient Instructions (Signed)
Please  - basal rates: 12 am: 2.1 units/h  - ICR:  12 am: 6 >> 4.5 5 pm: 6 >> 4.5  - target: 100-100 - ISF: 30 - Insulin on Board: 4h - bolus wizard: on  Please talk to Roxann Ripple about upgrading your pump.   Please return in 3 months with your sugar log.

## 2015-04-04 ENCOUNTER — Telehealth: Payer: Self-pay | Admitting: *Deleted

## 2015-04-04 NOTE — Telephone Encounter (Signed)
Left msg on triage stating pt is there seeing Dr. Elisabeth Most. She is wanting to know when was pt last seen & last BP check. Can fax to [phone]...Raechel Chute

## 2015-04-04 NOTE — Telephone Encounter (Signed)
Faxed last ov note...Johnny Hicks

## 2015-05-29 ENCOUNTER — Ambulatory Visit: Payer: 59

## 2015-06-05 ENCOUNTER — Other Ambulatory Visit: Payer: Self-pay | Admitting: Family

## 2015-07-01 ENCOUNTER — Ambulatory Visit: Payer: 59 | Admitting: Internal Medicine

## 2015-08-21 ENCOUNTER — Ambulatory Visit (INDEPENDENT_AMBULATORY_CARE_PROVIDER_SITE_OTHER): Payer: 59 | Admitting: Internal Medicine

## 2015-08-21 ENCOUNTER — Telehealth: Payer: Self-pay | Admitting: Internal Medicine

## 2015-08-21 ENCOUNTER — Other Ambulatory Visit: Payer: Self-pay | Admitting: Internal Medicine

## 2015-08-21 ENCOUNTER — Encounter: Payer: Self-pay | Admitting: Internal Medicine

## 2015-08-21 ENCOUNTER — Other Ambulatory Visit: Payer: Self-pay | Admitting: Family

## 2015-08-21 VITALS — BP 142/88 | HR 100 | Ht 72.0 in | Wt 284.0 lb

## 2015-08-21 DIAGNOSIS — E1065 Type 1 diabetes mellitus with hyperglycemia: Secondary | ICD-10-CM | POA: Diagnosis not present

## 2015-08-21 DIAGNOSIS — E103299 Type 1 diabetes mellitus with mild nonproliferative diabetic retinopathy without macular edema, unspecified eye: Secondary | ICD-10-CM

## 2015-08-21 DIAGNOSIS — IMO0002 Reserved for concepts with insufficient information to code with codable children: Secondary | ICD-10-CM

## 2015-08-21 MED ORDER — INSULIN LISPRO 100 UNIT/ML (KWIKPEN): 50.0000 [IU] | PEN_INJECTOR | Freq: Every day | SUBCUTANEOUS | Status: DC

## 2015-08-21 MED ORDER — METFORMIN HCL 500 MG PO TABS: 1000.0000 mg | ORAL_TABLET | Freq: Every day | ORAL | Status: DC

## 2015-08-21 NOTE — Addendum Note (Signed)
Addended by: Carlus PavlovGHERGHE, Arcadia Gorgas on: 08/21/2015 12:52 PM   Modules accepted: Orders, Medications

## 2015-08-21 NOTE — Telephone Encounter (Signed)
Rx submitted per pt's request.  

## 2015-08-21 NOTE — Progress Notes (Addendum)
Patient ID: Johnny Hicks, male   DOB: March 06, 1975, 40 y.o.   MRN: [id number]  HPI: Johnny Hicks is a 40 y.o.-year-old male, returning for f/u for DM1, dx 2001, uncontrolled, without complications. Last visit 5 mo ago.  New PCP: Dr. Rachell Hicks  He is considering a R en Y GBP.  Last hemoglobin A1c was: Lab Results  Component Value Date   HGBA1C 11.3 04/01/2015   HGBA1C 10.2* 06/26/2014  11/2013: HbA1c in the 9's 04/08/2012: GAD Ab 4.8 (<1.0)  Pt is on an insulin pump: Paradigm Revel 723 (Medtronic) - 40 years old, without a CGM (is not interested in one), uses Novolog in the pump.  Pump settings: - basal rates: 12 am: 2.1 >> 3 units/h  - ICR:  12 am: 6 >> 4.5 5 pm: 6 >> 4.5  - target: 100-100 - ISF: 30 >> 20 - Insulin on Board: 4h - bolus wizard: on TDD from basal insulin: 50.1 units (68%) TDD from bolus insulin: 47.3 units (32%)! - extended bolusing: not using - changes infusion site: q3-5 days - Meter: Bayer Contour Link  Pt checks his sugars 1.4 x a day and they are - ave 341 +/- 151 >> 243 +/- 138 >> 288 +/- 122 >> 278 +/- 134:   Previously:   - am: 58-370 >> <40 x1, 155-189 >> 77, 145-224, 398 >> 60, 80, 175-283 - 2h after b'fast: 205-370 >> 238->400 >> 299 >> 307->400 - before lunch: 205>400 >> 53-314 >> 233-381 >> 174-363 - 2h after lunch: 47-400 >> 57-232 >> >400 >> 83->400 - before dinner: 192->400 >> 49-333 >> 329->400 - 2h after dinner: 197->400 >> 56-308 >> 317-320 >> 365->400 - bedtime: see above >> 238->400 >> n/c  - nighttime: >400 >> <40->400 >> 312 >> 365 No lows. Lowest sugar was 60 (usually his low CBGs happen after correction of highs); he has hypoglycemia awareness at 60-70. No previous hypoglycemia admission. Has a glucagon kit at home. Highest sugar was >400. No previous DKA admissions.    Pt's meals are: - Breakfast: usually skips - Brunch (10-11 am): meat + starch + veggies (60g carbs) - Dinner (4-5 pm or later if busy at work):  ? - Snacks: usually not; but recently had some at night: chips, etc.  He was previously on a restricted diet at the beginning of the year and he started to have low CBGs, so he had to adjust his pump settings then. He walks or plays basketball 3-5 times a week.  - no CKD, last BUN/creatinine:  Lab Results  Component Value Date   BUN 22 08/22/2014   CREATININE 1.36 08/22/2014  On Lisinopril. - Has HL Lab Results  Component Value Date   CHOL 110 08/22/2014   HDL 30.00* 08/22/2014   LDLCALC 68 08/22/2014   TRIG 59.0 08/22/2014   CHOLHDL 4 08/22/2014   He was on Lipitor 20. Now off. - last eye exam was in 08/2014. + DR.  - no numbness and tingling in his feet. - + decreased erections  Lab Results  Component Value Date   TSH 0.99 06/26/2014    ROS: Constitutional: no weight gain/loss, no fatigue, no subjective hyperthermia/hypothermia Eyes: no blurry vision, no xerophthalmia ENT: no sore throat, no nodules palpated in throat, no dysphagia/odynophagia, no hoarseness Cardiovascular: no CP/SOB/palpitations/leg swelling Respiratory: no cough/SOB Gastrointestinal: no N/V/D/C Musculoskeletal: no muscle/joint aches Skin: no rashes Neurological: no tremors/numbness/tingling/dizziness + Low libido, problems with erections   I reviewed pt's medications, allergies, PMH,  social hx, family hx, and changes were documented in the history of present illness. Otherwise, unchanged from my initial visit note.Since last visit, his stopped his lisinopril/HCTZ and also Vyvanse. He started amlodipine.  Past Medical History  Diagnosis Date  . Hypertension   . Diabetes mellitus without complication (HCC)     Type 1  . ADHD (attention deficit hyperactivity disorder)    Past Surgical History  Procedure Laterality Date  . Shoulder surgery    . Knee arthroscopy     History   Social History  . Marital Status: Married    Spouse Name: N/A  . Number of Children: 27 y/o   Occupational History    . Sales - medical devices    Social History Main Topics  . Smoking status: Never Smoker   . Smokeless tobacco: Not on file  . Alcohol Use: Yes     Comment: Bourbon 2 drinks twice a month   . Drug Use: No   Current Outpatient Prescriptions on File Prior to Visit  Medication Sig Dispense Refill  . atorvastatin (LIPITOR) 20 MG tablet TAKE 1 TABLET (20 MG TOTAL) BY MOUTH DAILY. 90 tablet 1  . glucagon (GLUCAGON EMERGENCY) 1 MG injection Inject 1 mg into the muscle once as needed. 2 each prn  . glucose blood (BAYER CONTOUR NEXT TEST) test strip Use to test blood sugar 4 times daily as instructed. Dx: E10.65 125 each 11  . glucose blood test strip 1 each by Other route 3 (three) times daily. Use as instructed    . insulin lispro (HUMALOG KWIKPEN) 100 UNIT/ML KiwkPen Inject 0.5 mLs (50 Units total) into the skin daily. **PT NEEDS A FOLLOW UP APPT** 15 pen 0  . lisdexamfetamine (VYVANSE) 40 MG capsule Take 40 mg by mouth every morning.     No current facility-administered medications on file prior to visit.   Allergies  Allergen Reactions  . Penicillins Other (See Comments)    Pt does not remember last had as a baby   Family history: -  Brother died from complications of type 1 diabetes -  Mother and sister with hypertension  -  Sister with hyperlipidemia  -  Sister and father (deceased) with heart disease   PE: BP 142/88 mmHg  Pulse 100  Ht 6' (1.829 m)  Wt 284 lb (128.822 kg)  BMI 38.51 kg/m2  SpO2 92% Body mass index is 38.51 kg/(m^2). Wt Readings from Last 3 Encounters:  08/21/15 284 lb (128.822 kg)  04/01/15 268 lb 12.8 oz (121.927 kg)  02/08/15 268 lb 12.8 oz (121.927 kg)   Constitutional: overweight, in NAD Eyes: PERRLA, EOMI, no exophthalmos ENT: moist mucous membranes, no thyromegaly, no cervical lymphadenopathy Cardiovascular: RRR, No MRG Respiratory: CTA B Gastrointestinal: abdomen soft, NT, ND, BS+ Musculoskeletal: no deformities, strength intact in all 4 Skin:  moist, warm, no rashes Neurological: no tremor with outstretched hands, DTR normal in all 4  ASSESSMENT: 1. DM1, uncontrolled, without complications  2. Obesity  PLAN:  1. Patient with long-standing, uncontrolled DM1, on insulin pump therapy. Patient checks his sugars and boluses seldom as he is busy at work. As a consequence >> sugars are very high. He sometimes feels his sugars are dropping >> does not check often in these situations, but when he checks >> CBG 70s. He notices this happens if exercises or more active at work. I will advise him to use a temporary basal rate in these situation for the duration of the exercise and at least 1h  afterwards - as sugars are high throughout the day, especially after meals >> will decrease ICR  - he gets 68% TDD insulin from basal insulin and only 32% from boluses >> this is imbalanced >> needs to be at least 50-50% >> he needs to bolus more - advised him to enter all carbs in the pump - he is doing a great job changing his site q3-4 days and using only bolus wizard boluses - he is complaining of weight gain and he definitely has increased insulin resistance. We discussed about adding a half maximum dose of metformin, strongly advised him to stay very well hydrated when using this and to stop it immediately if he develops nausea or diarrhea or if he becomes dehydrated. He has been using this before and he tolerated it well. - We also discussed about the new Medtronic pump and we decided to obtain it after one year, which is what he has left from the current pump's warranty - We discussed about changes to his insulin regimen, as follows:  Patient Instructions  Please change the pump settings: - basal rates: 12 am: 3 units/h (2.5 units/h temporary basal rate if you are more active or you exercise and 1h afterwards) - ICR:  12 am: 4.5 >> 4 - target: 100-100 >> 110-110 - ISF: 20 - Insulin on Board: 4h - bolus wizard: on  Add Metformin 500 mg with  dinner x 3 days, then increase to 1000 mg with dinner.  Please return in 3 months with your sugar log.   - I again advised him to check sugars at different times of the day - check at least 4 times a day, rotating checks - advised for yearly eye exams >> he is up-to-date - no signs of other autoimmune disorders - checked HbA1c today by PCP >> will ask for records. Reviewed prev. HbA1c >> 11.3% (terrible) - return to clinic in 3 months   2. Obesity  - we discussed at length about pros and cons of gastric bypass  and different gastric bypass options   - time spent with the patient: 40 min, of which >50% was spent in reviewing his pump downloads, discussing his hypo- and hyper-glycemic episodes, reviewing previous labs and pump settings  and developing a plan to avoid hypo- and hyper-glycemia. we also discussed about his obesity.

## 2015-08-21 NOTE — Patient Instructions (Signed)
Please change the pump settings: - basal rates: 12 am: 3 units/h (2.5 units/h temporary basal rate if you are more active or you exercise and 1h afterwards) - ICR:  12 am: 4.5 >> 4 - target: 100-100 >> 110-110 - ISF: 20 - Insulin on Board: 4h - bolus wizard: on  Add Metformin 500 mg with dinner x 3 days, then increase to 1000 mg with dinner.  Please return in 3 months with your sugar log.

## 2015-08-21 NOTE — Telephone Encounter (Signed)
Patient need refiill of  insulin lispro (HUMALOG KWIKPEN) 100 UNIT/ML KiwkPen send to  [id number] - [place], [place] - [address]

## 2015-08-22 ENCOUNTER — Telehealth: Payer: Self-pay | Admitting: Internal Medicine

## 2015-08-22 ENCOUNTER — Other Ambulatory Visit: Payer: Self-pay

## 2015-08-22 MED ORDER — INSULIN LISPRO 100 UNIT/ML ~~LOC~~ SOLN: SUBCUTANEOUS | Status: DC

## 2015-08-22 NOTE — Telephone Encounter (Signed)
Rx submitted

## 2015-08-22 NOTE — Telephone Encounter (Signed)
PT called and needs the [facility]

## 2015-08-29 ENCOUNTER — Encounter: Payer: Self-pay | Admitting: Internal Medicine

## 2015-08-29 NOTE — Progress Notes (Signed)
Received labs from PCP, drawn on 08/21/2015: - Lipids: 158/67/41/104 - HbA1c 11.6% - BUN/creatinine 15/1.3, EGFR 78

## 2015-09-06 ENCOUNTER — Telehealth: Payer: Self-pay

## 2015-09-06 NOTE — Telephone Encounter (Signed)
This note was entered by mistake. °

## 2015-09-19 ENCOUNTER — Other Ambulatory Visit: Payer: Self-pay

## 2015-09-19 ENCOUNTER — Telehealth: Payer: Self-pay | Admitting: Internal Medicine

## 2015-09-19 MED ORDER — INSULIN LISPRO 100 UNIT/ML ~~LOC~~ SOLN: SUBCUTANEOUS | Status: DC

## 2015-09-19 NOTE — Telephone Encounter (Signed)
Patient need a refill of insulin lispro (HUMALOG) 100 UNIT/ML injection [id number] - [place], [place] - [address] [facility] [address] [address] [phone] (Phone) [phone] (Fax)

## 2015-09-20 ENCOUNTER — Other Ambulatory Visit: Payer: Self-pay

## 2015-09-20 MED ORDER — INSULIN LISPRO 100 UNIT/ML (KWIKPEN): PEN_INJECTOR | SUBCUTANEOUS | Status: DC

## 2015-09-21 ENCOUNTER — Other Ambulatory Visit: Payer: Self-pay | Admitting: Family

## 2015-09-23 ENCOUNTER — Other Ambulatory Visit: Payer: Self-pay

## 2015-09-23 MED ORDER — GLUCOSE BLOOD VI STRP: ORAL_STRIP | 12 refills | Status: DC

## 2015-09-23 NOTE — Telephone Encounter (Signed)
See below

## 2015-09-23 NOTE — Telephone Encounter (Signed)
For [employer]

## 2015-09-23 NOTE — Telephone Encounter (Signed)
PT insurance switched and now instead of Humulog, Novolog is Preferred through insurance.  He also stated that he usually gets 4 Vials of insulin and last script was only sent in for 2 vials. [facility] [place]

## 2015-09-23 NOTE — Telephone Encounter (Signed)
Rx sent electronically.  

## 2015-09-23 NOTE — Telephone Encounter (Signed)
Yes, same.

## 2015-09-24 ENCOUNTER — Other Ambulatory Visit: Payer: Self-pay

## 2015-09-24 MED ORDER — INSULIN ASPART 100 UNIT/ML ~~LOC~~ SOLN: SUBCUTANEOUS | 0 refills | Status: DC

## 2015-09-24 NOTE — Telephone Encounter (Signed)
Sent patient in Novolog to pharmacy as patients insurance no longer covers Humalog. Instructions are the same per MD. Sent patient 4 vials as requested.

## 2015-10-04 ENCOUNTER — Telehealth: Payer: Self-pay

## 2015-10-04 NOTE — Telephone Encounter (Signed)
Patient stated that insurance want him to switch to Novalog vials, from the Humalog.please advise

## 2015-10-04 NOTE — Telephone Encounter (Signed)
Patient called requesting Novolog because of insurance. I already sent in this RX awaiting them to pick up.

## 2015-10-11 ENCOUNTER — Other Ambulatory Visit: Payer: Self-pay

## 2015-10-11 MED ORDER — INSULIN ASPART 100 UNIT/ML FLEXPEN: PEN_INJECTOR | SUBCUTANEOUS | 0 refills | Status: DC

## 2015-10-11 MED ORDER — INSULIN ASPART 100 UNIT/ML ~~LOC~~ SOLN: SUBCUTANEOUS | 5 refills | Status: DC

## 2015-10-11 NOTE — Telephone Encounter (Signed)
Patient stated that he have be trying to get his prescription for Novalog vials. He is out of Humalog and he cannot refill his Novalog. Insurance changed and requested he change novalog. Out of vials for 3 week. Please advise

## 2015-10-11 NOTE — Telephone Encounter (Signed)
Changing from Humalog to Novolog per insurance would not cover anymore. Per MD okay to change.

## 2015-10-11 NOTE — Telephone Encounter (Signed)
Sent novolog vials into pharmacy.

## 2015-10-28 LAB — HM DIABETES EYE EXAM

## 2015-11-20 ENCOUNTER — Ambulatory Visit: Payer: 59 | Admitting: Internal Medicine

## 2015-11-29 ENCOUNTER — Telehealth: Payer: Self-pay | Admitting: Internal Medicine

## 2015-11-29 MED ORDER — INSULIN ASPART 100 UNIT/ML ~~LOC~~ SOLN: SUBCUTANEOUS | 5 refills | Status: DC

## 2015-11-29 NOTE — Telephone Encounter (Signed)
Pt called and said that he needs a new script for the Novolog Vials, he said he is using about 95-110 units daily with his pump.  Pt said he was getting about 3-4 vials for one month, but the last script was sent in for a lesser amount and he is already out. [facility]

## 2015-11-29 NOTE — Telephone Encounter (Signed)
OK to send the higher dose.

## 2015-11-29 NOTE — Telephone Encounter (Signed)
Refill submitted. 

## 2015-11-29 NOTE — Telephone Encounter (Signed)
See message and please advise if ok to update patient's prescription to the dosage in the message.

## 2016-01-09 ENCOUNTER — Ambulatory Visit (INDEPENDENT_AMBULATORY_CARE_PROVIDER_SITE_OTHER): Payer: PRIVATE HEALTH INSURANCE | Admitting: Internal Medicine

## 2016-01-09 ENCOUNTER — Encounter: Payer: Self-pay | Admitting: Internal Medicine

## 2016-01-09 VITALS — BP 132/84 | HR 111 | Ht 73.0 in | Wt 274.0 lb

## 2016-01-09 DIAGNOSIS — E103299 Type 1 diabetes mellitus with mild nonproliferative diabetic retinopathy without macular edema, unspecified eye: Secondary | ICD-10-CM | POA: Diagnosis not present

## 2016-01-09 DIAGNOSIS — E1065 Type 1 diabetes mellitus with hyperglycemia: Secondary | ICD-10-CM

## 2016-01-09 LAB — POCT GLYCOSYLATED HEMOGLOBIN (HGB A1C): Hemoglobin A1C: 10.7

## 2016-01-09 NOTE — Progress Notes (Signed)
Patient ID: Johnny Hicks, male   DOB: 11-Oct-1975, 40 y.o.   MRN: [id number]  HPI: Johnny Hicks is a 40 y.o.-year-old male, returning for f/u for DM1, dx 2001, uncontrolled, with complications ( CKD). Last visit 5 mo ago.  New PCP: Dr. Rachell Cipro  Since last visit >> changed his ADHD med to [place] 2 mo ago >> less appetite >> lost 10 lbs.   He was considering a R en Y GBP, now considering a gastric sleeve instead.  Last hemoglobin A1c was: Lab Results  Component Value Date   HGBA1C 11.3 04/01/2015   HGBA1C 10.2 (H) 06/26/2014  11/2013: HbA1c in the 9's 04/08/2012: GAD Ab 4.8 (<1.0)  Pt is on an insulin pump: Paradigm Revel 723 (Medtronic) - 40 years old, without a CGM (is not interested in one), uses Novolog in the pump.  He is taking: - Metformin 1000 mg in am Pump settings: - basal rates: 12 am: 2.1 >> 3 >> 2.5 units/h  - ICR:  12 am: 6 >> 4.5 >> 4.0 5 pm: 6 >> 4.5  - target: 100-100 >> 110-110 - ISF: 20 - Insulin on Board: 4h - bolus wizard: on TDD from basal insulin: 59.2 units (69%) TDD from bolus insulin: 26.9 units (31%)! - extended bolusing: not using - changes infusion site: q3 days - Meter: [employer]  He is still bolusing infrequently, and does not check his sugars frequently enough. He has days in which she is not checking sugars at all.  Pt checks his sugars 1.6 x a day and they are - ave 341 +/- 151 >> 243 +/- 138 >> 288 +/- 122 >> 278 +/- 134 >> 220 +/- 152 (will scan download report):   - am: 58-370 >> <40 x1, 155-189 >> 77, 145-224, 398 >> 60, 80, 175-283 >> 61-287 - 2h after b'fast: 205-370 >> 238->400 >> 299 >> 307->400 >> 78-149 - before lunch: 205>400 >> 53-314 >> 233-381 >> 174-363 >> 65-280 - 2h after lunch: 47-400 >> 57-232 >> >400 >> 83->400 >> 325->400 - before dinner: 192->400 >> 49-333 >> 329->400 >> 54-78 - 2h after dinner: 197->400 >> 56-308 >> 317-320 >> 365->400 >> >400 - bedtime: see above >> 238->400 >> n/c  -  nighttime: >400 >> <40->400 >> 312 >> 365 >> >400 No lows. Lowest sugar was 54(usually his low CBGs happen after correction of highs); he has hypoglycemia awareness at 60-70. No previous hypoglycemia admission. Has a glucagon kit at home. Highest sugar was >400. No previous DKA admissions.    Pt's meals are: - Breakfast: usually skips - Brunch (10-11 am): meat + starch + veggies (60g carbs) - Dinner (4-5 pm or later if busy at work): ? - Snacks: usually not He walks or plays basketball 3-5 times a week.  - + CKD, last BUN/creatinine:  Lab Results  Component Value Date   BUN 22 08/22/2014   CREATININE 1.36 08/22/2014  On Lisinopril. - Has HL Lab Results  Component Value Date   CHOL 110 08/22/2014   HDL 30.00 (L) 08/22/2014   LDLCALC 68 08/22/2014   TRIG 59.0 08/22/2014   CHOLHDL 4 08/22/2014   He was on Lipitor 20. Now off. - last eye exam was 10/28/2015. + DR.  - no numbness and tingling in his feet. - + decreased erections  Lab Results  Component Value Date   TSH 0.99 06/26/2014   Stopped Amlodipine, Vyvanse. Started HCTZ, Olmestartan.  ROS: Constitutional: no weight gain/loss, no fatigue,  no subjective hyperthermia/hypothermia Eyes: no blurry vision, no xerophthalmia ENT: no sore throat, no nodules palpated in throat, no dysphagia/odynophagia, no hoarseness Cardiovascular: no CP/SOB/palpitations/leg swelling Respiratory: no cough/SOB Gastrointestinal: no N/V/D/C Musculoskeletal: no muscle/joint aches Skin: no rashes Neurological: no tremors/numbness/tingling/dizziness + Low libido, problems with erections   I reviewed pt's medications, allergies, PMH, social hx, family hx, and changes were documented in the history of present illness. Otherwise, unchanged from my initial visit note.Since last visit, his stopped his lisinopril/HCTZ and also Vyvanse. He started amlodipine.  Past Medical History:  Diagnosis Date  . ADHD (attention deficit hyperactivity disorder)     . Diabetes mellitus without complication (HCC)    Type 1  . Hypertension    Past Surgical History:  Procedure Laterality Date  . KNEE ARTHROSCOPY    . SHOULDER SURGERY     History   Social History  . Marital Status: Married    Spouse Name: N/A  . Number of Children: 42 y/o   Occupational History  . Sales - medical devices    Social History Main Topics  . Smoking status: Never Smoker   . Smokeless tobacco: Not on file  . Alcohol Use: Yes     Comment: Bourbon 2 drinks twice a month   . Drug Use: No   Current Outpatient Prescriptions on File Prior to Visit  Medication Sig Dispense Refill  . atorvastatin (LIPITOR) 20 MG tablet TAKE 1 TABLET EVERY DAY 30 tablet 0  . glucagon (GLUCAGON EMERGENCY) 1 MG injection Inject 1 mg into the muscle once as needed. 2 each prn  . glucose blood (ONE TOUCH ULTRA TEST) test strip Use to test blood sugar 4 times a day as instructed. Dx Code: E10.65 125 each 12  . glucose blood test strip 1 each by Other route 3 (three) times daily. Use as instructed    . insulin aspart (NOVOLOG) 100 UNIT/ML injection Inject 110 units into the skin daily. 40 mL 5  . metFORMIN (GLUCOPHAGE) 500 MG tablet Take 2 tablets (1,000 mg total) by mouth daily with supper. 180 tablet 1   No current facility-administered medications on file prior to visit.    Allergies  Allergen Reactions  . Penicillins Other (See Comments)    Pt does not remember last had as a baby   Family history: -  Brother died from complications of type 1 diabetes -  Mother and sister with hypertension  -  Sister with hyperlipidemia  -  Sister and father (deceased) with heart disease   PE: BP 132/84   Pulse (!) 111   Ht '6\' 1"'  (1.854 m)   Wt 274 lb (124.3 kg)   SpO2 97%   BMI 36.15 kg/m  Body mass index is 36.15 kg/m. Wt Readings from Last 3 Encounters:  01/09/16 274 lb (124.3 kg)  08/21/15 284 lb (128.8 kg)  04/01/15 268 lb 12.8 oz (121.9 kg)   Constitutional: overweight, in  NAD Eyes: PERRLA, EOMI, no exophthalmos ENT: moist mucous membranes, no thyromegaly, no cervical lymphadenopathy Cardiovascular: RRR, No MRG Respiratory: CTA B Gastrointestinal: abdomen soft, NT, ND, BS+ Musculoskeletal: no deformities, strength intact in all 4 Skin: moist, warm, no rashes Neurological: no tremor with outstretched hands, DTR normal in all 4  ASSESSMENT: 1. DM1, uncontrolled, without complications  2. Obesity  PLAN:  1. Patient with long-standing, uncontrolled DM1, on insulin pump therapy. Patient checks his sugars and boluses infrequently and this has not improved despite multiple discussions about consequences at all our visits. As a  consequence, his  sugars are very high and it dropped precipitously after correction. He mostly relies on his basal rates for his insulin. He noticed low blood sugars recently, mostly after meals or after correction. Therefore, I will decrease his insulin to carb ratio with breakfast and lunch, and we'll increase his sensitivity factor, however, for now, I will maintain his basal rates - he gets 69% TDD insulin from basal insulin and only 31% from boluses >> this is imbalanced >> needs to be at least 50-50% >> he needs to bolus more.  -  again advised him to enter all carbs in the pump - he is  still doing a great job changing his site q3 days and using only bolus wizard boluses - At last visit, we added metformin, which she is tolerating well. We will continue this. He is taking it in the morning because he feels that he may forget to take it with dinner. - We again discussed about the new Medtronic pump and he can most likely get it in few months, after  current pump's warranty  expires - We discussed about changes to his insulin regimen, as follows:  Patient Instructions  Please change - basal rates: 12 am: 2.5 units/h  - ICR:  12 am: 4.0 >> 4.5 5 pm: 4.5 - target: 110-110 - ISF: 20 >> 25 - Insulin on Board: 4h - bolus wizard:  on  Please continue Metformin 1000 mg in am.  Please return in 3 months.  - I again advised him to check sugars at different times of the day - check at least 4 times a day, rotating checks - advised for yearly eye exams >> he is up-to-date - no signs of other autoimmune disorders >>will check his TSH today - We'll also check CMP, lipids, ACR  - Reviewed prev. HbA1c >> 11.3% (terrible). Today still high, 10.7%. - return to clinic in 3 months   2. Obesity  - at last visit  we discussed at length about pros and cons of gastric bypass  and different gastric bypass options  - He decided for the gastric sleeve   - Discussed about  scheduling an appointment with bariatric surgery   - time spent with the patient: 40 min, of which >50% was spent in reviewing his pump downloads, discussing his hypo- and hyper-glycemic episodes, reviewing previous labs and pump settings  and developing a plan to avoid hypo- and hyper-glycemia. we also discussed about his obesity.  Office Visit on 01/09/2016  Component Date Value Ref Range Status  . Hemoglobin A1C 01/09/2016 10.7   Final  . Cholesterol 01/10/2016 118  0 - 200 mg/dL Final  . Triglycerides 01/10/2016 190.0* 0.0 - 149.0 mg/dL Final  . HDL 01/10/2016 26.20* >39.00 mg/dL Final  . VLDL 01/10/2016 38.0  0.0 - 40.0 mg/dL Final  . LDL Cholesterol 01/10/2016 54  0 - 99 mg/dL Final  . Total CHOL/HDL Ratio 01/10/2016 5   Final  . NonHDL 01/10/2016 91.82   Final  . Sodium 01/10/2016 136  135 - 146 mmol/L Final  . Potassium 01/10/2016 4.3  3.5 - 5.3 mmol/L Final  . Chloride 01/10/2016 101  98 - 110 mmol/L Final  . CO2 01/10/2016 22  20 - 31 mmol/L Final  . Glucose, Bld 01/10/2016 232* 65 - 99 mg/dL Final  . BUN 01/10/2016 19  7 - 25 mg/dL Final  . Creat 01/10/2016 1.72* 0.60 - 1.35 mg/dL Final  . Total Bilirubin 01/10/2016 0.4  0.2 -  1.2 mg/dL Final  . Alkaline Phosphatase 01/10/2016 71  40 - 115 U/L Final  . AST 01/10/2016 18  10 - 40 U/L Final  .  ALT 01/10/2016 22  9 - 46 U/L Final  . Total Protein 01/10/2016 6.9  6.1 - 8.1 g/dL Final  . Albumin 01/10/2016 3.9  3.6 - 5.1 g/dL Final  . Calcium 01/10/2016 8.8  8.6 - 10.3 mg/dL Final  . GFR, Est African American 01/10/2016 55* >=60 mL/min Final  . GFR, Est Non African American 01/10/2016 48* >=60 mL/min Final  . TSH 01/10/2016 1.35  0.35 - 4.50 uIU/mL Final  . Microalb, Ur 01/10/2016 78.2* 0.0 - 1.9 mg/dL Final  . Creatinine,U 01/10/2016 406.2  mg/dL Final  . Microalb Creat Ratio 01/10/2016 19.3  0.0 - 30.0 mg/g Final   Message sent: Dear Mr. Guilmette, Your labs results are ok, except your kidney function is worse. Please try to stay well-hydrated, continue to work on getting the sugars under control, and I will recheck it at next visit. Sincerely, Philemon Kingdom MD  Philemon Kingdom, MD PhD [facility] Endocrinology

## 2016-01-09 NOTE — Patient Instructions (Addendum)
Please change - basal rates: 12 am: 2.5 units/h  - ICR:  12 am: 4.0 >> 4.5 5 pm: 4.5 - target: 110-110 - ISF: 20 >> 25 - Insulin on Board: 4h - bolus wizard: on  Please continue Metformin 1000 mg in am.  Please return in 3 months.

## 2016-01-10 LAB — COMPLETE METABOLIC PANEL WITH GFR
ALT: 22 U/L (ref 9–46)
AST: 18 U/L (ref 10–40)
Albumin: 3.9 g/dL (ref 3.6–5.1)
Alkaline Phosphatase: 71 U/L (ref 40–115)
BUN: 19 mg/dL (ref 7–25)
CHLORIDE: 101 mmol/L (ref 98–110)
CO2: 22 mmol/L (ref 20–31)
CREATININE: 1.72 mg/dL — AB (ref 0.60–1.35)
Calcium: 8.8 mg/dL (ref 8.6–10.3)
GFR, Est African American: 55 mL/min — ABNORMAL LOW (ref >=60)
GFR, Est Non African American: 48 mL/min — ABNORMAL LOW (ref >=60)
GLUCOSE: 232 mg/dL — AB (ref 65–99)
POTASSIUM: 4.3 mmol/L (ref 3.5–5.3)
SODIUM: 136 mmol/L (ref 135–146)
Total Bilirubin: 0.4 mg/dL (ref 0.2–1.2)
Total Protein: 6.9 g/dL (ref 6.1–8.1)

## 2016-01-10 LAB — MICROALBUMIN / CREATININE URINE RATIO
Creatinine,U: 406.2 mg/dL
MICROALB/CREAT RATIO: 19.3 mg/g (ref 0.0–30.0)
Microalb, Ur: 78.2 mg/dL — ABNORMAL HIGH (ref 0.0–1.9)

## 2016-01-10 LAB — LIPID PANEL
CHOL/HDL RATIO: 5
CHOLESTEROL: 118 mg/dL (ref 0–200)
HDL: 26.2 mg/dL — ABNORMAL LOW (ref >39.00)
LDL CALC: 54 mg/dL (ref 0–99)
NonHDL: 91.82
TRIGLYCERIDES: 190 mg/dL — AB (ref 0.0–149.0)
VLDL: 38 mg/dL (ref 0.0–40.0)

## 2016-01-10 LAB — TSH: TSH: 1.35 u[IU]/mL (ref 0.35–4.50)

## 2016-03-21 ENCOUNTER — Other Ambulatory Visit: Payer: Self-pay | Admitting: Internal Medicine

## 2016-04-23 ENCOUNTER — Other Ambulatory Visit: Payer: Self-pay | Admitting: Internal Medicine

## 2016-04-23 MED ORDER — METFORMIN HCL 500 MG PO TABS: ORAL_TABLET | ORAL | 0 refills | Status: DC

## 2016-05-04 ENCOUNTER — Encounter: Payer: Self-pay | Admitting: Internal Medicine

## 2016-06-10 ENCOUNTER — Telehealth: Payer: Self-pay

## 2016-06-10 NOTE — Telephone Encounter (Signed)
Patient called regarding the weight log he was needing. I advised patient to contact medical records as they may be able to assist him in going back that far. Patient understood. Patient also made appointment with Dr.Gherghe when her schedule opens up. Patient had no other questions.

## 2016-06-20 ENCOUNTER — Other Ambulatory Visit: Payer: Self-pay | Admitting: Internal Medicine

## 2016-06-22 NOTE — Telephone Encounter (Signed)
This patient has an appt. Coming up on 09/28/2016. Do you want me to fill with 0 refill until they come in?  Please advise.  Thank you!  Alison Murray.

## 2016-06-24 ENCOUNTER — Other Ambulatory Visit: Payer: Self-pay

## 2016-06-24 ENCOUNTER — Telehealth: Payer: Self-pay | Admitting: Internal Medicine

## 2016-06-24 MED ORDER — INSULIN ASPART 100 UNIT/ML ~~LOC~~ SOLN: SUBCUTANEOUS | 5 refills | Status: DC

## 2016-06-24 NOTE — Telephone Encounter (Signed)
Submitted

## 2016-06-24 NOTE — Telephone Encounter (Signed)
Pharmacy called to request a refill for   insulin aspart (NOVOLOG) 100 UNIT/ML injection   [facility] [address], [place]

## 2016-07-23 DIAGNOSIS — Z79899 Other long term (current) drug therapy: Secondary | ICD-10-CM | POA: Diagnosis not present

## 2016-07-23 DIAGNOSIS — I10 Essential (primary) hypertension: Secondary | ICD-10-CM | POA: Diagnosis not present

## 2016-08-04 ENCOUNTER — Other Ambulatory Visit: Payer: Self-pay

## 2016-08-04 MED ORDER — INSULIN LISPRO 100 UNIT/ML ~~LOC~~ SOLN: SUBCUTANEOUS | 1 refills | Status: DC

## 2016-08-21 ENCOUNTER — Other Ambulatory Visit: Payer: Self-pay | Admitting: Internal Medicine

## 2016-09-28 ENCOUNTER — Ambulatory Visit: Payer: Self-pay | Admitting: Internal Medicine

## 2016-10-06 ENCOUNTER — Other Ambulatory Visit: Payer: Self-pay | Admitting: Internal Medicine

## 2016-10-13 DIAGNOSIS — J310 Chronic rhinitis: Secondary | ICD-10-CM | POA: Diagnosis not present

## 2016-10-14 DIAGNOSIS — E1065 Type 1 diabetes mellitus with hyperglycemia: Secondary | ICD-10-CM | POA: Diagnosis not present

## 2016-10-18 ENCOUNTER — Other Ambulatory Visit: Payer: Self-pay | Admitting: Internal Medicine

## 2016-10-26 DIAGNOSIS — Z79899 Other long term (current) drug therapy: Secondary | ICD-10-CM | POA: Diagnosis not present

## 2016-11-03 ENCOUNTER — Encounter: Payer: Self-pay | Admitting: Internal Medicine

## 2016-11-03 DIAGNOSIS — Z23 Encounter for immunization: Secondary | ICD-10-CM | POA: Diagnosis not present

## 2016-11-12 DIAGNOSIS — Z23 Encounter for immunization: Secondary | ICD-10-CM | POA: Diagnosis not present

## 2016-11-12 DIAGNOSIS — E782 Mixed hyperlipidemia: Secondary | ICD-10-CM | POA: Diagnosis not present

## 2016-11-26 ENCOUNTER — Encounter: Payer: Self-pay | Admitting: Internal Medicine

## 2016-11-26 ENCOUNTER — Ambulatory Visit: Payer: Self-pay | Admitting: Internal Medicine

## 2016-11-26 DIAGNOSIS — Z0289 Encounter for other administrative examinations: Secondary | ICD-10-CM

## 2016-11-30 ENCOUNTER — Other Ambulatory Visit: Payer: Self-pay | Admitting: Internal Medicine

## 2016-11-30 ENCOUNTER — Encounter: Payer: Self-pay | Admitting: Internal Medicine

## 2016-11-30 ENCOUNTER — Ambulatory Visit: Payer: 59 | Admitting: Internal Medicine

## 2016-12-09 DIAGNOSIS — I10 Essential (primary) hypertension: Secondary | ICD-10-CM | POA: Diagnosis not present

## 2016-12-09 DIAGNOSIS — E119 Type 2 diabetes mellitus without complications: Secondary | ICD-10-CM | POA: Diagnosis not present

## 2016-12-14 DIAGNOSIS — E109 Type 1 diabetes mellitus without complications: Secondary | ICD-10-CM | POA: Diagnosis not present

## 2016-12-14 DIAGNOSIS — I10 Essential (primary) hypertension: Secondary | ICD-10-CM | POA: Diagnosis not present

## 2016-12-18 ENCOUNTER — Telehealth: Payer: Self-pay | Admitting: Internal Medicine

## 2016-12-18 NOTE — Telephone Encounter (Signed)
Patient dismissed from [facility] by Carlus Pavlovristina Gherghe MD , effective November 30, 2016. Dismissal letter sent out by certified / registered mail.  daj

## 2016-12-28 DIAGNOSIS — E1065 Type 1 diabetes mellitus with hyperglycemia: Secondary | ICD-10-CM | POA: Diagnosis not present

## 2016-12-31 NOTE — Telephone Encounter (Signed)
Received signed domestic return receipt verifying delivery of certified letter on December 21, 2016. Article number [id number] [phone] daj

## 2017-01-07 DIAGNOSIS — I10 Essential (primary) hypertension: Secondary | ICD-10-CM | POA: Diagnosis not present

## 2017-01-07 DIAGNOSIS — J029 Acute pharyngitis, unspecified: Secondary | ICD-10-CM | POA: Diagnosis not present

## 2017-01-07 DIAGNOSIS — E109 Type 1 diabetes mellitus without complications: Secondary | ICD-10-CM | POA: Diagnosis not present

## 2017-01-28 DIAGNOSIS — Z125 Encounter for screening for malignant neoplasm of prostate: Secondary | ICD-10-CM | POA: Diagnosis not present

## 2017-01-28 DIAGNOSIS — Z0000 Encounter for general adult medical examination without abnormal findings: Secondary | ICD-10-CM | POA: Diagnosis not present

## 2017-02-01 DIAGNOSIS — I10 Essential (primary) hypertension: Secondary | ICD-10-CM | POA: Diagnosis not present

## 2017-02-01 DIAGNOSIS — Z0000 Encounter for general adult medical examination without abnormal findings: Secondary | ICD-10-CM | POA: Diagnosis not present

## 2017-02-01 DIAGNOSIS — Z79899 Other long term (current) drug therapy: Secondary | ICD-10-CM | POA: Diagnosis not present

## 2017-03-11 DIAGNOSIS — E119 Type 2 diabetes mellitus without complications: Secondary | ICD-10-CM | POA: Diagnosis not present

## 2017-03-11 DIAGNOSIS — I10 Essential (primary) hypertension: Secondary | ICD-10-CM | POA: Diagnosis not present

## 2017-03-18 DIAGNOSIS — E1065 Type 1 diabetes mellitus with hyperglycemia: Secondary | ICD-10-CM | POA: Diagnosis not present

## 2017-03-22 DIAGNOSIS — E109 Type 1 diabetes mellitus without complications: Secondary | ICD-10-CM | POA: Diagnosis not present

## 2017-03-22 DIAGNOSIS — I10 Essential (primary) hypertension: Secondary | ICD-10-CM | POA: Diagnosis not present

## 2017-03-22 DIAGNOSIS — N289 Disorder of kidney and ureter, unspecified: Secondary | ICD-10-CM | POA: Diagnosis not present

## 2017-04-02 DIAGNOSIS — E1065 Type 1 diabetes mellitus with hyperglycemia: Secondary | ICD-10-CM | POA: Diagnosis not present

## 2017-04-17 ENCOUNTER — Other Ambulatory Visit: Payer: Self-pay | Admitting: Internal Medicine

## 2017-04-29 DIAGNOSIS — I10 Essential (primary) hypertension: Secondary | ICD-10-CM | POA: Diagnosis not present

## 2017-04-29 DIAGNOSIS — Z79899 Other long term (current) drug therapy: Secondary | ICD-10-CM | POA: Diagnosis not present

## 2017-05-18 DIAGNOSIS — E1065 Type 1 diabetes mellitus with hyperglycemia: Secondary | ICD-10-CM | POA: Diagnosis not present

## 2017-05-21 ENCOUNTER — Other Ambulatory Visit: Payer: Self-pay

## 2017-06-15 DIAGNOSIS — E1065 Type 1 diabetes mellitus with hyperglycemia: Secondary | ICD-10-CM | POA: Diagnosis not present

## 2017-06-28 DIAGNOSIS — I10 Essential (primary) hypertension: Secondary | ICD-10-CM | POA: Diagnosis not present

## 2017-06-28 DIAGNOSIS — E109 Type 1 diabetes mellitus without complications: Secondary | ICD-10-CM | POA: Diagnosis not present

## 2017-07-27 DIAGNOSIS — Z79899 Other long term (current) drug therapy: Secondary | ICD-10-CM | POA: Diagnosis not present

## 2017-08-30 DIAGNOSIS — N289 Disorder of kidney and ureter, unspecified: Secondary | ICD-10-CM | POA: Diagnosis not present

## 2017-08-30 DIAGNOSIS — I10 Essential (primary) hypertension: Secondary | ICD-10-CM | POA: Diagnosis not present

## 2017-08-30 DIAGNOSIS — E782 Mixed hyperlipidemia: Secondary | ICD-10-CM | POA: Diagnosis not present

## 2017-09-01 DIAGNOSIS — E109 Type 1 diabetes mellitus without complications: Secondary | ICD-10-CM | POA: Diagnosis not present

## 2017-09-01 DIAGNOSIS — E782 Mixed hyperlipidemia: Secondary | ICD-10-CM | POA: Diagnosis not present

## 2017-09-01 DIAGNOSIS — I10 Essential (primary) hypertension: Secondary | ICD-10-CM | POA: Diagnosis not present

## 2017-09-10 DIAGNOSIS — E1065 Type 1 diabetes mellitus with hyperglycemia: Secondary | ICD-10-CM | POA: Diagnosis not present

## 2017-09-10 DIAGNOSIS — J320 Chronic maxillary sinusitis: Secondary | ICD-10-CM | POA: Diagnosis not present

## 2017-09-10 DIAGNOSIS — J322 Chronic ethmoidal sinusitis: Secondary | ICD-10-CM | POA: Diagnosis not present

## 2017-09-10 DIAGNOSIS — J321 Chronic frontal sinusitis: Secondary | ICD-10-CM | POA: Diagnosis not present

## 2017-09-10 DIAGNOSIS — J342 Deviated nasal septum: Secondary | ICD-10-CM | POA: Diagnosis not present

## 2017-09-14 DIAGNOSIS — Z23 Encounter for immunization: Secondary | ICD-10-CM | POA: Diagnosis not present

## 2017-10-27 DIAGNOSIS — I10 Essential (primary) hypertension: Secondary | ICD-10-CM | POA: Diagnosis not present

## 2017-10-27 DIAGNOSIS — Z79899 Other long term (current) drug therapy: Secondary | ICD-10-CM | POA: Diagnosis not present

## 2017-11-03 ENCOUNTER — Other Ambulatory Visit: Payer: Self-pay | Admitting: Internal Medicine

## 2017-11-05 DIAGNOSIS — E1065 Type 1 diabetes mellitus with hyperglycemia: Secondary | ICD-10-CM | POA: Diagnosis not present

## 2017-11-08 DIAGNOSIS — Z9641 Presence of insulin pump (external) (internal): Secondary | ICD-10-CM | POA: Diagnosis not present

## 2017-11-08 DIAGNOSIS — E1065 Type 1 diabetes mellitus with hyperglycemia: Secondary | ICD-10-CM | POA: Diagnosis not present

## 2017-11-22 DIAGNOSIS — Z23 Encounter for immunization: Secondary | ICD-10-CM | POA: Diagnosis not present

## 2017-12-14 DIAGNOSIS — E1065 Type 1 diabetes mellitus with hyperglycemia: Secondary | ICD-10-CM | POA: Diagnosis not present

## 2017-12-30 DIAGNOSIS — E1065 Type 1 diabetes mellitus with hyperglycemia: Secondary | ICD-10-CM | POA: Diagnosis not present

## 2018-01-03 DIAGNOSIS — J320 Chronic maxillary sinusitis: Secondary | ICD-10-CM | POA: Diagnosis not present

## 2018-01-03 DIAGNOSIS — J342 Deviated nasal septum: Secondary | ICD-10-CM | POA: Diagnosis not present

## 2018-01-03 DIAGNOSIS — J343 Hypertrophy of nasal turbinates: Secondary | ICD-10-CM | POA: Diagnosis not present

## 2018-01-03 DIAGNOSIS — J3489 Other specified disorders of nose and nasal sinuses: Secondary | ICD-10-CM | POA: Diagnosis not present

## 2018-01-07 DIAGNOSIS — J322 Chronic ethmoidal sinusitis: Secondary | ICD-10-CM | POA: Diagnosis not present

## 2018-01-07 DIAGNOSIS — J320 Chronic maxillary sinusitis: Secondary | ICD-10-CM | POA: Diagnosis not present

## 2018-01-09 ENCOUNTER — Emergency Department (HOSPITAL_COMMUNITY): Payer: 59

## 2018-01-09 ENCOUNTER — Encounter (HOSPITAL_COMMUNITY): Payer: Self-pay | Admitting: Emergency Medicine

## 2018-01-09 ENCOUNTER — Emergency Department (HOSPITAL_COMMUNITY)
Admission: EM | Admit: 2018-01-09 | Discharge: 2018-01-09 | Disposition: A | Discharge status: Home / Self Care | Payer: 59 | Attending: Emergency Medicine | Admitting: Emergency Medicine

## 2018-01-09 ENCOUNTER — Other Ambulatory Visit: Payer: Self-pay

## 2018-01-09 DIAGNOSIS — F909 Attention-deficit hyperactivity disorder, unspecified type: Secondary | ICD-10-CM | POA: Insufficient documentation

## 2018-01-09 DIAGNOSIS — J0100 Acute maxillary sinusitis, unspecified: Secondary | ICD-10-CM

## 2018-01-09 DIAGNOSIS — R51 Headache: Secondary | ICD-10-CM | POA: Diagnosis not present

## 2018-01-09 DIAGNOSIS — Z79899 Other long term (current) drug therapy: Secondary | ICD-10-CM | POA: Diagnosis not present

## 2018-01-09 DIAGNOSIS — J019 Acute sinusitis, unspecified: Secondary | ICD-10-CM | POA: Insufficient documentation

## 2018-01-09 DIAGNOSIS — E109 Type 1 diabetes mellitus without complications: Secondary | ICD-10-CM | POA: Insufficient documentation

## 2018-01-09 DIAGNOSIS — I10 Essential (primary) hypertension: Secondary | ICD-10-CM | POA: Insufficient documentation

## 2018-01-09 LAB — CBC WITH DIFFERENTIAL/PLATELET
Abs Immature Granulocytes: 0.06 10*3/uL (ref 0.00–0.07)
BASOS PCT: 0 %
Basophils Absolute: 0 10*3/uL (ref 0.0–0.1)
EOS ABS: 0.1 10*3/uL (ref 0.0–0.5)
EOS PCT: 1 %
HEMATOCRIT: 43.6 % (ref 39.0–52.0)
Hemoglobin: 13.5 g/dL (ref 13.0–17.0)
Immature Granulocytes: 1 %
LYMPHS ABS: 2.6 10*3/uL (ref 0.7–4.0)
Lymphocytes Relative: 21 %
MCH: 24.6 pg — AB (ref 26.0–34.0)
MCHC: 31 g/dL (ref 30.0–36.0)
MCV: 79.6 fL — ABNORMAL LOW (ref 80.0–100.0)
MONOS PCT: 11 %
Monocytes Absolute: 1.4 10*3/uL — ABNORMAL HIGH (ref 0.1–1.0)
NEUTROS PCT: 66 %
Neutro Abs: 8 10*3/uL — ABNORMAL HIGH (ref 1.7–7.7)
PLATELETS: 243 10*3/uL (ref 150–400)
RBC: 5.48 MIL/uL (ref 4.22–5.81)
RDW: 13.4 % (ref 11.5–15.5)
WBC: 12.1 10*3/uL — ABNORMAL HIGH (ref 4.0–10.5)
nRBC: 0 % (ref 0.0–0.2)

## 2018-01-09 LAB — BASIC METABOLIC PANEL
ANION GAP: 8 (ref 5–15)
BUN: 16 mg/dL (ref 6–20)
CALCIUM: 8.5 mg/dL — AB (ref 8.9–10.3)
CO2: 26 mmol/L (ref 22–32)
CREATININE: 1.63 mg/dL — AB (ref 0.61–1.24)
Chloride: 95 mmol/L — ABNORMAL LOW (ref 98–111)
GFR, EST AFRICAN AMERICAN: 58 mL/min — AB (ref >60)
GFR, EST NON AFRICAN AMERICAN: 50 mL/min — AB (ref >60)
GLUCOSE: 133 mg/dL — AB (ref 70–99)
Potassium: 3.6 mmol/L (ref 3.5–5.1)
Sodium: 129 mmol/L — ABNORMAL LOW (ref 135–145)

## 2018-01-09 MED ORDER — SODIUM CHLORIDE 0.9 % IV SOLN
1.0000 g | Freq: Once | INTRAVENOUS | Status: AC
  Administered 2018-01-09: 1 g via INTRAVENOUS
  Filled 2018-01-09: qty 10

## 2018-01-09 MED ORDER — IBUPROFEN 400 MG PO TABS
400.0000 mg | ORAL_TABLET | Freq: Once | ORAL | Status: AC
  Administered 2018-01-09: 400 mg via ORAL
  Filled 2018-01-09: qty 1

## 2018-01-09 MED ORDER — DEXAMETHASONE SODIUM PHOSPHATE 10 MG/ML IJ SOLN
10.0000 mg | Freq: Once | INTRAMUSCULAR | Status: AC
  Administered 2018-01-09: 10 mg via INTRAVENOUS
  Filled 2018-01-09: qty 1

## 2018-01-09 MED ORDER — ACETAMINOPHEN 500 MG PO TABS
1000.0000 mg | ORAL_TABLET | Freq: Once | ORAL | Status: AC
  Administered 2018-01-09: 1000 mg via ORAL
  Filled 2018-01-09: qty 2

## 2018-01-09 NOTE — ED Triage Notes (Signed)
States he had a nasal procedure last Monday , noticed blood presuure has been elevated. C/o headache , increased nasal drainage.

## 2018-01-09 NOTE — ED Provider Notes (Addendum)
[facility] EMERGENCY DEPARTMENT Provider Note   CSN: [id number] Arrival date & time: 01/09/18  1037     History   Chief Complaint Chief Complaint  Patient presents with  . Hypertension    HPI Johnny Hicks is a 42 y.o. male.  Patient is a 43 year old male who presents with elevated blood pressure and not feeling well.  He states that he had sinus surgery by ENT and mouth area [place] on Monday which was 6 days ago.  He was noted to have an elevated blood pressure on that visit.  He went back for a follow-up visit on Friday and his blood pressure was also noted to be elevated.  Starting on Friday he started feeling bad with flulike symptoms being achy all over and noted that he was having some chills and running low-grade fevers.  He also started having a headache to the left frontal area and around his left eye.  He still has that headache today.  He has no dizziness.  No facial numbness.  No vision changes.  No cough or chest congestion.  No chest pain or shortness of breath.  No urinary symptoms.  No nausea or vomiting.  He is having some bloody mucousy drainage which he has been told is normal after the sinus surgery.  He is not on antibiotics.  He has noted his blood pressure to be running between 170 and 200 systolic.  He doubled up on his blood pressure medications over the weekend with no improvement in symptoms.     Past Medical History:  Diagnosis Date  . ADHD (attention deficit hyperactivity disorder)   . Diabetes mellitus without complication (HCC)    Type 1  . Hypertension     Patient Active Problem List   Diagnosis Date Noted  . Morbid obesity (HCC) 08/21/2015  . 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 Sinusitis, acute 09/18/2014  . Erectile dysfunction 07/31/2014  . Essential hypertension 07/31/2014    Past Surgical History:  Procedure Laterality Date    . KNEE ARTHROSCOPY    . NASAL SEPTUM SURGERY    . SHOULDER SURGERY          Home Medications    Prior to Admission medications   Medication Sig Start Date End Date Taking? Authorizing Provider  atorvastatin (LIPITOR) 20 MG tablet TAKE 1 TABLET EVERY DAY 09/22/15   Veryl Speak, FNP  glucagon (GLUCAGON EMERGENCY) 1 MG injection Inject 1 mg into the muscle once as needed. 06/26/14   Carlus Pavlov, MD  glucose blood test strip 1 each by Other route 3 (three) times daily. Use as instructed    [provider]  HUMALOG 100 UNIT/ML injection INJECT 110 UNITS INTO THE SKIN DAILY 11/30/16   Carlus Pavlov, MD  metFORMIN (GLUCOPHAGE) 500 MG tablet TAKE TWO TABLETS EVERYDAY WITH SUPPER. 08/21/16   Carlus Pavlov, MD  MYDAYIS 37.5 MG CP24  12/20/15   [provider]  Olmesartan-Amlodipine-HCTZ 40-10-25 MG TABS  11/30/15   [provider]  ONE TOUCH ULTRA TEST test strip USE TO TEST BLOOD SUGAR 4 TIMES A DAY AS INSTRUCTED. DX CODE: E10.65 10/19/16   Carlus Pavlov, MD    Family History Family History  Problem Relation Age of Onset  . Heart attack Father   . Stroke Mother   . Heart disease Mother   . Lung cancer Maternal Grandmother     Social History Social History  Tobacco Use  . Smoking status: Never Smoker  . Smokeless tobacco: Never Used  Substance Use Topics  . Alcohol use: Yes    Comment: rarely  . Drug use: No     Allergies   Penicillins   Review of Systems Review of Systems  Constitutional: Positive for fatigue and fever. Negative for chills and diaphoresis.  HENT: Positive for congestion and sinus pressure. Negative for rhinorrhea and sneezing.   Eyes: Negative.   Respiratory: Negative for cough, chest tightness and shortness of breath.   Cardiovascular: Negative for chest pain and leg swelling.  Gastrointestinal: Negative for abdominal pain, blood in stool, diarrhea, nausea and vomiting.  Genitourinary: Negative for  difficulty urinating, flank pain, frequency and hematuria.  Musculoskeletal: Negative for arthralgias and back pain.  Skin: Negative for rash.  Neurological: Positive for headaches. Negative for dizziness, speech difficulty, weakness and numbness.     Physical Exam Updated Vital Signs BP (!) 153/94   Pulse 86   Temp (!) 101.3 F (38.5 C) (Oral) Comment: patient eating oalmeal  Resp 15   Ht 5\' 11"  (1.803 m)   Wt 129.3 kg   SpO2 96%   BMI 39.75 kg/m   Physical Exam  Constitutional: He is oriented to person, place, and time. He appears well-developed and well-nourished.  HENT:  Head: Normocephalic and atraumatic.  Nose: Nose normal.  Mouth/Throat: Oropharynx is clear and moist.  Eyes: Pupils are equal, round, and reactive to light.  Neck: Normal range of motion. Neck supple.  Cardiovascular: Normal rate, regular rhythm and normal heart sounds.  Pulmonary/Chest: Effort normal and breath sounds normal. No respiratory distress. He has no wheezes. He has no rales. He exhibits no tenderness.  Abdominal: Soft. Bowel sounds are normal. There is no tenderness. There is no rebound and no guarding.  Musculoskeletal: Normal range of motion. He exhibits no edema.  Lymphadenopathy:    He has no cervical adenopathy.  Neurological: He is alert and oriented to person, place, and time.  Motor 5/5 all extremities Sensation grossly intact to LT all extremities Finger to Nose intact, no pronator drift CN II-XII grossly intact    Skin: Skin is warm and dry. No rash noted.  Psychiatric: He has a normal mood and affect.     ED Treatments / Results  Labs (all labs ordered are listed, but only abnormal results are displayed) Labs Reviewed  BASIC METABOLIC PANEL - Abnormal; Notable for the following components:      Result Value   Sodium 129 (*)    Chloride 95 (*)    Glucose, Bld 133 (*)    Creatinine, Ser 1.63 (*)    Calcium 8.5 (*)    GFR calc non Af Amer 50 (*)    GFR calc Af Amer 58  (*)    All other components within normal limits  CBC WITH DIFFERENTIAL/PLATELET - Abnormal; Notable for the following components:   WBC 12.1 (*)    MCV 79.6 (*)    MCH 24.6 (*)    Neutro Abs 8.0 (*)    Monocytes Absolute 1.4 (*)    All other components within normal limits    EKG EKG Interpretation  Date/Time:  Sunday January 09 2018 10:47:56 EST Ventricular Rate:  89 PR Interval:    QRS Duration: 99 QT Interval:  364 QTC Calculation: 443 R Axis:   55 Text Interpretation:  Sinus rhythm Multiple premature complexes, vent & supraven Probable left atrial enlargement since last tracing no significant change Confirmed by  Rolan Bucco ([id number]) on 01/09/2018 11:12:56 AM   Radiology Ct Head Wo Contrast  Result Date: 01/09/2018 CLINICAL DATA:  Headache for 3 days.  Recent sinus surgery. EXAM: CT HEAD WITHOUT CONTRAST TECHNIQUE: Contiguous axial images were obtained from the base of the skull through the vertex without intravenous contrast. COMPARISON:  None. FINDINGS: Brain: No evidence of acute infarction, hemorrhage, hydrocephalus, extra-axial collection or mass lesion/mass effect. Vascular: No hyperdense vessel or unexpected calcification. Skull: Normal. Negative for fracture or focal lesion. Sinuses/Orbits: Normal globes and orbits. There is sinus disease. Left maxillary sinus is mostly opacified. There is moderate mucosal thickening with dependent fluid in the right maxillary sinus. Moderate mucosal thickening lines the ethmoid air cells. Mild mucosal thickening in the inferior left frontal sinus with minor mucosal thickening along the anterior sphenoid sinuses. Clear mastoid air cells. Other: None. IMPRESSION: 1. No intracranial abnormalities. 2. Sinus disease as detailed above, including an air-fluid level in the right maxillary sinus. Electronically Signed   By: Amie Portland M.D.   On: 01/09/2018 11:51    Procedures Procedures (including critical care time)  Medications Ordered  in ED Medications  acetaminophen (TYLENOL) tablet 1,000 mg (1,000 mg Oral Given 01/09/18 1104)  cefTRIAXone (ROCEPHIN) 1 g in sodium chloride 0.9 % 100 mL IVPB (1 g Intravenous New Bag/Given 01/09/18 1306)  dexamethasone (DECADRON) injection 10 mg (10 mg Intravenous Given 01/09/18 1256)  ibuprofen (ADVIL,MOTRIN) tablet 400 mg (400 mg Oral Given 01/09/18 1256)     Initial Impression / Assessment and Plan / ED Course  I have reviewed the triage vital signs and the nursing notes.  Pertinent labs & imaging results that were available during my care of the patient were reviewed by me and considered in my medical decision making (see chart for details).     Patient is a 42 year old male who presents with a fever after recent sinus surgery.  His blood pressures been elevated but actually have improved without treatment in the ED.  His blood pressure is currently 153/94 and prior to that was 148/91.  He is not really symptomatic from his elevated blood pressures.  He does have left-sided headache and evidence of sinusitis on CT scan.  Given his recent surgery, I did contact his ENT, Dr. Rennis Harding in [facility].  He recommends giving him a dose of Rocephin and Decadron in the ED.  Patient does have an insulin pump and states that there is no concern with him titrating his insulin pump to increase blood sugars.  Dr. Rennis Harding will see the patient tomorrow in the office to wash out his sinuses and has also called in antibiotics to his pharmacy in [facility] that he will start later today.  Return precautions were given.  I did advise him he will need to follow-up with his PCP regarding his blood pressure management.  Final Clinical Impressions(s) / ED Diagnoses   Final diagnoses:  Essential hypertension  Acute non-recurrent maxillary sinusitis    ED Discharge Orders    None       Rolan Bucco, MD 01/09/18 1344    Rolan Bucco, MD 01/09/18 1344

## 2018-01-09 NOTE — ED Notes (Signed)
Discharge instructions reviewed with patient and spouse. Pt verbalized understanding.

## 2018-01-11 DIAGNOSIS — I10 Essential (primary) hypertension: Secondary | ICD-10-CM | POA: Diagnosis not present

## 2018-01-11 DIAGNOSIS — G4700 Insomnia, unspecified: Secondary | ICD-10-CM | POA: Diagnosis not present

## 2018-01-11 DIAGNOSIS — N289 Disorder of kidney and ureter, unspecified: Secondary | ICD-10-CM | POA: Diagnosis not present

## 2018-01-14 DIAGNOSIS — J322 Chronic ethmoidal sinusitis: Secondary | ICD-10-CM | POA: Diagnosis not present

## 2018-01-14 DIAGNOSIS — J320 Chronic maxillary sinusitis: Secondary | ICD-10-CM | POA: Diagnosis not present

## 2018-01-24 DIAGNOSIS — Z79899 Other long term (current) drug therapy: Secondary | ICD-10-CM | POA: Diagnosis not present

## 2018-01-25 DIAGNOSIS — Z6841 Body Mass Index (BMI) 40.0 and over, adult: Secondary | ICD-10-CM | POA: Diagnosis not present

## 2018-01-25 DIAGNOSIS — G4700 Insomnia, unspecified: Secondary | ICD-10-CM | POA: Diagnosis not present

## 2018-01-25 DIAGNOSIS — I10 Essential (primary) hypertension: Secondary | ICD-10-CM | POA: Diagnosis not present

## 2018-02-09 DIAGNOSIS — Z0000 Encounter for general adult medical examination without abnormal findings: Secondary | ICD-10-CM | POA: Diagnosis not present

## 2018-02-09 DIAGNOSIS — Z125 Encounter for screening for malignant neoplasm of prostate: Secondary | ICD-10-CM | POA: Diagnosis not present

## 2018-02-14 DIAGNOSIS — E1065 Type 1 diabetes mellitus with hyperglycemia: Secondary | ICD-10-CM | POA: Diagnosis not present

## 2018-02-16 DIAGNOSIS — Z0000 Encounter for general adult medical examination without abnormal findings: Secondary | ICD-10-CM | POA: Diagnosis not present

## 2018-02-16 DIAGNOSIS — Z6839 Body mass index (BMI) 39.0-39.9, adult: Secondary | ICD-10-CM | POA: Diagnosis not present

## 2018-02-18 DIAGNOSIS — E119 Type 2 diabetes mellitus without complications: Secondary | ICD-10-CM | POA: Diagnosis not present

## 2018-02-18 DIAGNOSIS — H5213 Myopia, bilateral: Secondary | ICD-10-CM | POA: Diagnosis not present

## 2018-02-18 DIAGNOSIS — H40013 Open angle with borderline findings, low risk, bilateral: Secondary | ICD-10-CM | POA: Diagnosis not present

## 2018-02-24 DIAGNOSIS — N289 Disorder of kidney and ureter, unspecified: Secondary | ICD-10-CM | POA: Diagnosis not present

## 2018-02-24 DIAGNOSIS — Z23 Encounter for immunization: Secondary | ICD-10-CM | POA: Diagnosis not present

## 2018-02-24 DIAGNOSIS — E782 Mixed hyperlipidemia: Secondary | ICD-10-CM | POA: Diagnosis not present

## 2018-02-24 DIAGNOSIS — I10 Essential (primary) hypertension: Secondary | ICD-10-CM | POA: Diagnosis not present

## 2018-07-20 ENCOUNTER — Ambulatory Visit (INDEPENDENT_AMBULATORY_CARE_PROVIDER_SITE_OTHER): Payer: Managed Care, Other (non HMO) | Admitting: Cardiology

## 2018-07-20 ENCOUNTER — Encounter: Payer: Self-pay | Admitting: Cardiology

## 2018-07-20 ENCOUNTER — Other Ambulatory Visit: Payer: Self-pay

## 2018-07-20 VITALS — BP 182/119 | HR 76 | Ht 72.0 in | Wt 286.0 lb

## 2018-07-20 DIAGNOSIS — I10 Essential (primary) hypertension: Secondary | ICD-10-CM

## 2018-07-20 DIAGNOSIS — I491 Atrial premature depolarization: Secondary | ICD-10-CM

## 2018-07-20 DIAGNOSIS — E103299 Type 1 diabetes mellitus with mild nonproliferative diabetic retinopathy without macular edema, unspecified eye: Secondary | ICD-10-CM | POA: Diagnosis not present

## 2018-07-20 DIAGNOSIS — E1065 Type 1 diabetes mellitus with hyperglycemia: Secondary | ICD-10-CM

## 2018-07-20 DIAGNOSIS — IMO0002 Reserved for concepts with insufficient information to code with codable children: Secondary | ICD-10-CM

## 2018-07-20 MED ORDER — ISOSORBIDE DINITRATE 20 MG PO TABS: 20.0000 mg | ORAL_TABLET | Freq: Three times a day (TID) | ORAL | 1 refills | Status: DC

## 2018-07-20 MED ORDER — HYDRALAZINE HCL 50 MG PO TABS: 50.0000 mg | ORAL_TABLET | Freq: Three times a day (TID) | ORAL | 3 refills | Status: DC

## 2018-07-20 NOTE — Progress Notes (Signed)
Primary Physician:  Lewis Moccasinewey, Elizabeth R, MD   Patient ID: Johnny Hicks, male    DOB: 1976-02-22, 43 y.o.   MRN: [id number]  Subjective:    Chief Complaint  Patient presents with   New Patient (Initial Visit)   Hypertension    HPI: Johnny CompanionGlendon John Hicks  is a 43 y.o. male  with uncontrolled type 1 diabetes, hypertension, referred to us by Dr. Maryelizabeth RowanElizabeth Dewey for evaluation of difficult to control hypertension.   He reports found to have hypertension 5 years ago and has since been continued to be uncontrolled despite multiple medications. He denies any chest pain, shortness of breath, palpitations, leg edema, PND, or symptoms suggestive of claudication or TIA. Does have some intermittent belching and gets slightly short of breath with this.   He states that his last HgbA1c was 10%. He is followed by [facility].   Family history of heart disease in his father at age 45. Also reports that his sister has had some heart issues (unsure).   He does try to obtain 70,000 steps per week  Past Medical History:  Diagnosis Date   ADHD (attention deficit hyperactivity disorder)    Diabetes mellitus without complication (HCC)    Type 1   Hypertension     Past Surgical History:  Procedure Laterality Date   KNEE ARTHROSCOPY     NASAL SEPTUM SURGERY     SHOULDER SURGERY      Social History   Socioeconomic History   Marital status: Married    Spouse name: Not on file   Number of children: 1   Years of education: 16   Highest education level: Not on file  Occupational History   Not on file  Social Needs   Financial resource strain: Not on file   Food insecurity:    Worry: Not on file    Inability: Not on file   Transportation needs:    Medical: Not on file    Non-medical: Not on file  Tobacco Use   Smoking status: Never Smoker   Smokeless tobacco: Never Used  Substance and Sexual Activity   Alcohol use: Yes    Comment: rarely   Drug use:  No   Sexual activity: Not on file  Lifestyle   Physical activity:    Days per week: Not on file    Minutes per session: Not on file   Stress: Not on file  Relationships   Social connections:    Talks on phone: Not on file    Gets together: Not on file    Attends religious service: Not on file    Active member of club or organization: Not on file    Attends meetings of clubs or organizations: Not on file    Relationship status: Not on file   Intimate partner violence:    Fear of current or ex partner: Not on file    Emotionally abused: Not on file    Physically abused: Not on file    Forced sexual activity: Not on file  Other Topics Concern   Not on file  Social History Narrative   Fun: Play basketball, Pascal Lux[employer]illing   Denies religious beliefs effecting health care.     Review of Systems  Constitution: Negative for decreased appetite, malaise/fatigue, weight gain and weight loss.  Eyes: Negative for visual disturbance.  Cardiovascular: Negative for chest pain, claudication, dyspnea on exertion, leg swelling, orthopnea, palpitations and syncope.  Respiratory: Negative for hemoptysis and wheezing.  Endocrine: Negative for cold intolerance and heat intolerance.  Hematologic/Lymphatic: Does not bruise/bleed easily.  Skin: Negative for nail changes.  Musculoskeletal: Negative for muscle weakness and myalgias.  Gastrointestinal: Positive for heartburn. Negative for abdominal pain, change in bowel habit, nausea and vomiting.  Neurological: Negative for difficulty with concentration, dizziness, focal weakness and headaches.  Psychiatric/Behavioral: Negative for altered mental status and suicidal ideas.  All other systems reviewed and are negative.     Objective:  Blood pressure (!) 182/119, pulse 76, height 6' (1.829 m), weight 286 lb (129.7 kg), SpO2 98 %. Body mass index is 38.79 kg/m.    Physical Exam  Constitutional: He is oriented to person, place, and time. Vital signs  are normal. He appears well-developed and well-nourished.  HENT:  Head: Normocephalic and atraumatic.  Neck: Normal range of motion. Neck supple.  Cardiovascular: Normal rate, regular rhythm, normal heart sounds and intact distal pulses. Frequent extrasystoles are present.  Pulses:      Femoral pulses are 2+ on the right side and 2+ on the left side.      Popliteal pulses are 2+ on the right side and 2+ on the left side.       Dorsalis pedis pulses are 2+ on the right side and 2+ on the left side.       Posterior tibial pulses are 2+ on the right side and 2+ on the left side.  Pulmonary/Chest: Effort normal and breath sounds normal. No accessory muscle usage. No respiratory distress.  Abdominal: Soft. Bowel sounds are normal.  Musculoskeletal: Normal range of motion.  Neurological: He is alert and oriented to person, place, and time.  Skin: Skin is warm and dry.  Vitals reviewed.  Radiology: No results found.  Laboratory examination:    CMP Latest Ref Rng & Units 01/09/2018 01/09/2016 08/22/2014  Glucose 70 - 99 mg/dL 161(W) 960(A) 540(J)  BUN 6 - 20 mg/dL Creatinine 0.61 - 1.24 mg/dL 8.11(B) 1.47(W) 2.95  Sodium 135 - 145 mmol/L 129(L) 136 135  Potassium 3.5 - 5.1 mmol/L 3.6 4.3 4.1  Chloride 98 - 111 mmol/L 95(L) 101 101  CO2 22 - 32 mmol/L Calcium 8.9 - 10.3 mg/dL 6.2(Z) 8.8 8.6  Total Protein 6.1 - 8.1 g/dL - 6.9 6.6  Total Bilirubin 0.2 - 1.2 mg/dL - 0.4 0.6  Alkaline Phos 40 - 115 U/L - 71 77  AST 10 - 40 U/L - 18 14  ALT 9 - 46 U/L - 22 17   CBC Latest Ref Rng & Units 01/09/2018 04/27/2014  WBC 4.0 - 10.5 K/uL 12.1(H) 8.0  Hemoglobin 13.0 - 17.0 g/dL 30.8 65.7  Hematocrit 84.6 - 52.0 % 43.6 45.9  Platelets 150 - 400 K/uL 243 220   Lipid Panel     Component Value Date/Time   CHOL 118 01/09/2016 1712   TRIG 190.0 (H) 01/09/2016 1712   HDL 26.20 (L) 01/09/2016 1712   CHOLHDL 5 01/09/2016 1712   VLDL 38.0 01/09/2016 1712   LDLCALC 54 01/09/2016  1712   HEMOGLOBIN A1C Lab Results  Component Value Date   HGBA1C 10.7 01/09/2016   TSH No results for input(s): TSH in the last 8760 hours.  PRN Meds:. Medications Discontinued During This Encounter  Medication Reason   metFORMIN (GLUCOPHAGE) 500 MG tablet Discontinued by provider   MYDAYIS 37.5 MG CP24 Discontinued by provider   Olmesartan-Amlodipine-HCTZ 40-10-25 MG TABS Change in therapy   HUMALOG 100 UNIT/ML injection Change in  therapy   Current Meds  Medication Sig   amLODipine (NORVASC) 10 MG tablet daily.   amphetamine-dextroamphetamine (ADDERALL XR) 25 MG 24 hr capsule daily.   atorvastatin (LIPITOR) 20 MG tablet TAKE 1 TABLET EVERY DAY   BYSTOLIC 20 MG TABS Take 1 tablet by mouth daily.   cyclobenzaprine (FLEXERIL) 10 MG tablet TAKE 1 TABLET (10 MG) BY MOUTH AT BEDTIME AND UP TO 3 TIMES PER DAY AS NEEDED   Dulaglutide (TRULICITY) 0.75 MG/0.5ML SOPN Inject into the skin once a week.   glucagon (GLUCAGON EMERGENCY) 1 MG injection Inject 1 mg into the muscle once as needed.   glucose blood test strip 1 each by Other route 3 (three) times daily. Use as instructed   hydrALAZINE (APRESOLINE) 50 MG tablet 3 (three) times daily.   hydrochlorothiazide (HYDRODIURIL) 25 MG tablet daily.   insulin aspart (NOVOLOG FLEXPEN) 100 UNIT/ML FlexPen Inject into the skin 3 (three) times daily with meals. Sliding scale   olmesartan (BENICAR) 40 MG tablet daily.   ONE TOUCH ULTRA TEST test strip USE TO TEST BLOOD SUGAR 4 TIMES A DAY AS INSTRUCTED. 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 T wave abnormality in lateral leads, cannot exclude ischemia vs. repolarization abnormality.   Recommendations:   Patient with uncontrolled type 1 diabetes, obesity, referred to [place] for resistant hypertension.  Patient has markedly elevated blood pressure despite multiple anti-hypertensive therapy.  He would benefit from evaluation for secondary hypertension. No symptoms suggestive of OSA. Will obtain renal artery duplex to exclude renal artery stenosis.  We will also perform BMP, renin aldosterone activity ratio, and urine metanephrines for further evaluation.  He has just started hydralazine today.  We will add isosorbide dinitrate 20 mg 3 times daily.  Can consider switching to BiDil.  He is without symptoms of angina, although has risk factors.  We will hold off on stress testing at this point in view of lack of symptoms.  EKG suggestive of hypertensive heart disease.  Will obtain echocardiogram for further evaluation. Has asymptomatic PACs.  If we are not able to control his blood pressure, he may benefit from hypertension study at [facility] or possibly renal artery denervation.  We will further discuss and decide once other testing is complete.  Patient is fairly active and tolerates this well.  He is mostly following a diet, we have discussed restricting his pork intake; however, do not suspect this will significantly affect his blood pressure control.  He does continue to work to control his diabetes.  He has been managed by [facility]. I will see him back after the test for further recommendations and reevaluation.   *I have discussed this case with Dr. Rosemary Holms and he personally examined the patient and participated in formulating the plan.*    Toniann Fail, MSN, APRN, FNP-C [facility] [place] Office: [phone] Fax: [phone]

## 2018-07-21 NOTE — Progress Notes (Signed)
Did you see this patient

## 2018-07-23 LAB — ALDOSTERONE + RENIN ACTIVITY W/ RATIO
ALDOS/RENIN RATIO: 2 (ref 0.0–30.0)
ALDOSTERONE: 1.5 ng/dL (ref 0.0–30.0)
Renin: 0.759 ng/mL/hr (ref 0.167–5.380)

## 2018-07-23 LAB — BASIC METABOLIC PANEL
BUN/Creatinine Ratio: 16 (ref 9–20)
BUN: 21 mg/dL (ref 6–24)
CO2: 26 mmol/L (ref 20–29)
Calcium: 8.8 mg/dL (ref 8.7–10.2)
Chloride: 101 mmol/L (ref 96–106)
Creatinine, Ser: 1.35 mg/dL — ABNORMAL HIGH (ref 0.76–1.27)
GFR calc Af Amer: 73 mL/min/{1.73_m2} (ref >59)
GFR calc non Af Amer: 63 mL/min/{1.73_m2} (ref >59)
Glucose: 280 mg/dL — ABNORMAL HIGH (ref 65–99)
Potassium: 4.7 mmol/L (ref 3.5–5.2)
Sodium: 140 mmol/L (ref 134–144)

## 2018-08-11 ENCOUNTER — Other Ambulatory Visit: Payer: Self-pay | Admitting: Cardiology

## 2018-08-26 ENCOUNTER — Ambulatory Visit: Payer: Managed Care, Other (non HMO)

## 2018-08-26 ENCOUNTER — Ambulatory Visit (INDEPENDENT_AMBULATORY_CARE_PROVIDER_SITE_OTHER): Payer: Managed Care, Other (non HMO)

## 2018-08-26 ENCOUNTER — Other Ambulatory Visit: Payer: Self-pay

## 2018-08-26 DIAGNOSIS — I10 Essential (primary) hypertension: Secondary | ICD-10-CM

## 2018-08-26 DIAGNOSIS — I491 Atrial premature depolarization: Secondary | ICD-10-CM

## 2018-09-01 ENCOUNTER — Encounter: Payer: Self-pay | Admitting: Cardiology

## 2018-09-05 ENCOUNTER — Ambulatory Visit (INDEPENDENT_AMBULATORY_CARE_PROVIDER_SITE_OTHER): Payer: Managed Care, Other (non HMO) | Admitting: Cardiology

## 2018-09-05 ENCOUNTER — Other Ambulatory Visit: Payer: Self-pay

## 2018-09-05 ENCOUNTER — Encounter: Payer: Self-pay | Admitting: Cardiology

## 2018-09-05 VITALS — BP 148/101 | HR 78 | Ht 72.0 in | Wt 276.0 lb

## 2018-09-05 DIAGNOSIS — E103299 Type 1 diabetes mellitus with mild nonproliferative diabetic retinopathy without macular edema, unspecified eye: Secondary | ICD-10-CM

## 2018-09-05 DIAGNOSIS — I10 Essential (primary) hypertension: Secondary | ICD-10-CM | POA: Diagnosis not present

## 2018-09-05 DIAGNOSIS — E1065 Type 1 diabetes mellitus with hyperglycemia: Secondary | ICD-10-CM | POA: Diagnosis not present

## 2018-09-05 DIAGNOSIS — IMO0002 Reserved for concepts with insufficient information to code with codable children: Secondary | ICD-10-CM

## 2018-09-05 MED ORDER — OLMESARTAN-AMLODIPINE-HCTZ 40-10-25 MG PO TABS: 1.0000 | ORAL_TABLET | Freq: Every day | ORAL | 3 refills | Status: DC

## 2018-09-05 MED ORDER — BIDIL 20-37.5 MG PO TABS: 1.0000 | ORAL_TABLET | Freq: Three times a day (TID) | ORAL | 1 refills | Status: DC

## 2018-09-05 NOTE — Progress Notes (Signed)
Primary Physician:  [name], MD   Patient ID: Johnny Hicks, male    DOB: 1976/02/01, 43 y.o.   MRN: [id number]  Subjective:    Chief Complaint  Patient presents with  . Hypertension  . Results    echo, art dup, labs  . Follow-up   This visit type was conducted due to national recommendations for restrictions regarding the COVID-19 Pandemic (e.g. social distancing).  This format is felt to be most appropriate for this patient at this time.  All issues noted in this document were discussed and addressed.  No physical exam was performed (except for noted visual exam findings with Telehealth visits).  The patient has consented to conduct a Telehealth visit and understands insurance will be billed.   I discussed the limitations of evaluation and management by telemedicine and the availability of in person appointments. The patient expressed understanding and agreed to proceed.  Virtual Visit via Video Note is as below  I connected with Mr. Ambrosius, on 09/05/18 at 1035 by a video enabled telemedicine application and verified that I am speaking with the correct person using two identifiers.     I have discussed with her regarding the safety during COVID Pandemic and steps and precautions including social distancing with the patient.    HPI: Johnny Hicks  is a 43 y.o. male  with uncontrolled type 1 diabetes, with difficult to control hypertension.  He underwent echocardiogram on 08/26/2018 that showed moderate LVH, grade 1 diastolic dysfunction, normal LVEF. Also underwent renal artery duplex at that time that did not reveal any renal artery stenosis. Aldosterone/renin ratio was also within normal limits. He was started on Isosorbide at his last office visit as he had just started hydralazine. He now presents for follow up.   He reports found to have hypertension 5 years ago and has since been continued to be uncontrolled despite multiple medications. He denies any chest  pain, shortness of breath, palpitations, leg edema, PND, or symptoms suggestive of claudication or TIA. Does have some intermittent belching and gets slightly short of breath with this.   He states that his last HgbA1c was 10%. He is followed by [facility].   Family history of heart disease in his father at age 41. Also reports that his sister has had some heart issues (unsure).   He does try to obtain 70,000 steps per week. He works in Programmer, applications.  Past Medical History:  Diagnosis Date  . ADHD (attention deficit hyperactivity disorder)   . Diabetes mellitus without complication (HCC)    Type 1  . Hypertension     Past Surgical History:  Procedure Laterality Date  . KNEE ARTHROSCOPY    . NASAL SEPTUM SURGERY    . SHOULDER SURGERY      Social History   Socioeconomic History  . Marital status: Married    Spouse name: Not on file  . Number of children: 2  . Years of education: 48  . Highest education level: Not on file  Occupational History  . Not on file  Social Needs  . Financial resource strain: Not on file  . Food insecurity    Worry: Not on file    Inability: Not on file  . Transportation needs    Medical: Not on file    Non-medical: Not on file  Tobacco Use  . Smoking status: Never Smoker  . Smokeless tobacco: Never Used  Substance and Sexual Activity  . Alcohol use: Yes  Comment: rarely  . Drug use: No  . Sexual activity: Not on file  Lifestyle  . Physical activity    Days per week: Not on file    Minutes per session: Not on file  . Stress: Not on file  Relationships  . Social Musicianconnections    Talks on phone: Not on file    Gets together: Not on file    Attends religious service: Not on file    Active member of club or organization: Not on file    Attends meetings of clubs or organizations: Not on file    Relationship status: Not on file  . Intimate partner violence    Fear of current or ex partner: Not on file    Emotionally  abused: Not on file    Physically abused: Not on file    Forced sexual activity: Not on file  Other Topics Concern  . Not on file  Social History Narrative   Fun: Play basketball, Pascal LuxGrilling   Denies religious beliefs effecting health care.     Review of Systems  Constitution: Negative for decreased appetite, malaise/fatigue, weight gain and weight loss.  Eyes: Negative for visual disturbance.  Cardiovascular: Negative for chest pain, claudication, dyspnea on exertion, leg swelling, orthopnea, palpitations and syncope.  Respiratory: Negative for hemoptysis and wheezing.   Endocrine: Negative for cold intolerance and heat intolerance.  Hematologic/Lymphatic: Does not bruise/bleed easily.  Skin: Negative for nail changes.  Musculoskeletal: Negative for muscle weakness and myalgias.  Gastrointestinal: Positive for heartburn. Negative for abdominal pain, change in bowel habit, nausea and vomiting.  Neurological: Negative for difficulty with concentration, dizziness, focal weakness and headaches.  Psychiatric/Behavioral: Negative for altered mental status and suicidal ideas.  All other systems reviewed and are negative.     Objective:  Blood pressure (!) 148/101, pulse 78, height 6' (1.829 m), weight 276 lb (125.2 kg). Body mass index is 37.43 kg/m.    Physical Exam  Constitutional: He is oriented to person, place, and time. Vital signs are normal. He appears well-developed and well-nourished.  HENT:  Head: Normocephalic and atraumatic.  Neck: Normal range of motion. Neck supple.  Cardiovascular: Normal rate, regular rhythm, normal heart sounds and intact distal pulses. Frequent extrasystoles are present.  Pulses:      Femoral pulses are 2+ on the right side and 2+ on the left side.      Popliteal pulses are 2+ on the right side and 2+ on the left side.       Dorsalis pedis pulses are 2+ on the right side and 2+ on the left side.       Posterior tibial pulses are 2+ on the right side  and 2+ on the left side.  Pulmonary/Chest: Effort normal and breath sounds normal. No accessory muscle usage. No respiratory distress.  Abdominal: Soft. Bowel sounds are normal.  Musculoskeletal: Normal range of motion.  Neurological: He is alert and oriented to person, place, and time.  Skin: Skin is warm and dry.  Vitals reviewed.  Radiology: No results found.  Laboratory examination:    CMP Latest Ref Rng & Units 07/20/2018 01/09/2018 01/09/2016  Glucose 65 - 99 mg/dL 161(W280(H) 960(A133(H) 540(J232(H)  BUN 6 - 24 mg/dL 21 16 19   Creatinine 0.76 - 1.27 mg/dL 8.11(B1.35(H) 1.47(W1.63(H) 2.95(A1.72(H)  Sodium 134 - 144 mmol/L 140 129(L) 136  Potassium 3.5 - 5.2 mmol/L 4.7 3.6 4.3  Chloride 96 - 106 mmol/L 101 95(L) 101  CO2 20 - 29 mmol/L 26 26  22  Calcium 8.7 - 10.2 mg/dL 8.8 2.1(H8.5(L) 8.8  Total Protein 6.1 - 8.1 g/dL - - 6.9  Total Bilirubin 0.2 - 1.2 mg/dL - - 0.4  Alkaline Phos 40 - 115 U/L - - 71  AST 10 - 40 U/L - - 18  ALT 9 - 46 U/L - - 22   CBC Latest Ref Rng & Units 01/09/2018 04/27/2014  WBC 4.0 - 10.5 K/uL 12.1(H) 8.0  Hemoglobin 13.0 - 17.0 g/dL 08.613.5 57.815.3  Hematocrit 46.939.0 - 52.0 % 43.6 45.9  Platelets 150 - 400 K/uL 243 220   Lipid Panel     Component Value Date/Time   CHOL 118 01/09/2016 1712   TRIG 190.0 (H) 01/09/2016 1712   HDL 26.20 (L) 01/09/2016 1712   CHOLHDL 5 01/09/2016 1712   VLDL 38.0 01/09/2016 1712   LDLCALC 54 01/09/2016 1712   HEMOGLOBIN A1C Lab Results  Component Value Date   HGBA1C 10.7 01/09/2016   TSH No results for input(s): TSH in the last 8760 hours.  PRN Meds:. Medications Discontinued During This Encounter  Medication Reason  . cyclobenzaprine (FLEXERIL) 10 MG tablet   . glucagon (GLUCAGON EMERGENCY) 1 MG injection   . TRESIBA FLEXTOUCH 100 UNIT/ML SOPN FlexTouch Pen    Current Meds  Medication Sig  . amLODipine (NORVASC) 10 MG tablet daily.  [name]. amphetamine-dextroamphetamine (ADDERALL XR) 25 MG 24 hr capsule daily.  [name]. atorvastatin (LIPITOR) 20 MG  tablet TAKE 1 TABLET EVERY DAY  . BYSTOLIC 20 MG TABS Take 1 tablet by mouth daily.  . Dulaglutide (TRULICITY) 0.75 MG/0.5ML SOPN Inject into the skin once a week.  . hydrALAZINE (APRESOLINE) 50 MG tablet Take 1 tablet (50 mg total) by mouth 3 (three) times daily for 30 days. (Patient taking differently: Take 50 mg by mouth 2 (two) times a day. )  . hydrochlorothiazide (HYDRODIURIL) 25 MG tablet daily.  . insulin aspart (NOVOLOG FLEXPEN) 100 UNIT/ML FlexPen Inject into the skin 3 (three) times daily with meals. Sliding scale  . Insulin Glargine (BASAGLAR KWIKPEN) 100 UNIT/ML SOPN Inject 45 Units into the skin daily.  . isosorbide dinitrate (ISORDIL) 20 MG tablet TAKE 1 TABLET BY MOUTH THREE TIMES A DAY  . olmesartan (BENICAR) 40 MG tablet daily.  [name]. omeprazole (PRILOSEC) 40 MG capsule Take by mouth daily.    Cardiac Studies:   Echocardiogram 08/26/2018 :  Left ventricle cavity is normal in size. Moderate concentric hypertrophy of the left ventricle. 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  Normal LV systolic function with EF 58%. Normal global wall motion. Doppler evidence of grade I (impaired) diastolic dysfunction, normal LAP. Calculated EF 58%. Left atrial cavity is moderately dilated at 4.5 cm. Mild (Grade I) mitral regurgitation.  Renal artery duplex  08/26/2018: No evidence of renal artery occlusive disease in either renal artery. Right kidney measures 10.5 x 5 x 5 cm, Left kidney measures 10.3 x 5.4 x 5.4 cm.  Normal bilateral intrarenal resistive index.  Assessment:     ICD-10-CM   1. Resistant hypertension  I10   2. 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 Morbid obesity (HCC)  E66.01     EKG 07/20/2018: Normal sinus rhythm at 72 bpm with 1 PAC and 1 blocked PAC, left atrial enlargement, borderline criteria for LVH. T wave abnormality in lateral leads, cannot exclude ischemia vs. repolarization  abnormality.   Recommendations:   Patient reports significant improvement  with addition of hydralazine and isosorbide, but has been out of his medication for the last 1 day and blood pressure is again markedly elevated.  I discussed recently obtained echocardiogram results with the patient, has blood pressure changes with moderate LVH and grade 1 diastolic dysfunction.  Normal LVEF.  No evidence of renal artery stenosis by renal artery duplex.  I will combine hydralazine and isosorbide to BiDil 1 tablet 3 times a day for his hypertension.  He also request combining his other blood pressure medications for ease.  He is without symptoms of chest pain or shortness of breath.  Do not feel that he needs stress testing at this point in view of lack of symptoms.  He is working with endocrinology for management of diabetes.  I will see him back in 2 to 3 months for follow-up, but encouraged him to contact me sooner if needed.   Toniann FailAshton Haynes Wylie Coon, MSN, APRN, FNP-C [facility] [place] Office: [phone] Fax: [phone]

## 2018-09-06 ENCOUNTER — Encounter: Payer: Self-pay | Admitting: Cardiology

## 2018-09-12 ENCOUNTER — Other Ambulatory Visit: Payer: Self-pay | Admitting: Cardiology

## 2018-10-08 ENCOUNTER — Other Ambulatory Visit: Payer: Self-pay | Admitting: Cardiology

## 2018-12-08 ENCOUNTER — Ambulatory Visit (INDEPENDENT_AMBULATORY_CARE_PROVIDER_SITE_OTHER): Payer: Managed Care, Other (non HMO) | Admitting: Cardiology

## 2018-12-08 ENCOUNTER — Other Ambulatory Visit: Payer: Self-pay

## 2018-12-08 ENCOUNTER — Encounter: Payer: Self-pay | Admitting: Cardiology

## 2018-12-08 VITALS — BP 137/88 | HR 93 | Temp 97.5°F | Ht 71.0 in | Wt 283.0 lb

## 2018-12-08 DIAGNOSIS — I10 Essential (primary) hypertension: Secondary | ICD-10-CM | POA: Diagnosis not present

## 2018-12-08 DIAGNOSIS — E1065 Type 1 diabetes mellitus with hyperglycemia: Secondary | ICD-10-CM | POA: Diagnosis not present

## 2018-12-08 DIAGNOSIS — IMO0002 Reserved for concepts with insufficient information to code with codable children: Secondary | ICD-10-CM

## 2018-12-08 DIAGNOSIS — E103299 Type 1 diabetes mellitus with mild nonproliferative diabetic retinopathy without macular edema, unspecified eye: Secondary | ICD-10-CM | POA: Diagnosis not present

## 2018-12-08 NOTE — Progress Notes (Signed)
Primary Physician:  Lewis Moccasin, MD   Patient ID: Johnny Hicks, male    DOB: [birth year], 43 y.o.   MRN: [id number]  Subjective:    Chief Complaint  Patient presents with  . Hypertension  . Follow-up    3 month   HPI: Johnny Hicks  is a 43 y.o. male  with uncontrolled type 1 diabetes, with difficult to control hypertension.  He underwent echocardiogram on 08/26/2018 that showed moderate LVH, grade 1 diastolic dysfunction, normal LVEF. Also underwent renal artery duplex at that time that did not reveal any renal artery stenosis. Aldosterone/renin ratio was also within normal limits.   Since being on BiDil, states that blood pressure is much improved.  But he has been missing taking 3 times a day dose and mostly takes once a day.  Diabetes still continues to be uncontrolled.  He is followed by [facility].   Family history of heart disease in his father at age 59. Also reports that his sister has had some heart issues (unsure).   He does try to obtain 70,000 steps per week. He works in Field seismologist.  Past Medical History:  Diagnosis Date  . ADHD (attention deficit hyperactivity disorder)   . Diabetes mellitus without complication (HCC)    Type 1  . Hypertension     Past Surgical History:  Procedure Laterality Date  . KNEE ARTHROSCOPY    . NASAL SEPTUM SURGERY    . SHOULDER SURGERY      Social History   Socioeconomic History  . Marital status: Married    Spouse name: Not on file  . Number of children: 2  . Years of education: 39  . Highest education level: Not on file  Occupational History  . Not on file  Social Needs  . Financial resource strain: Not on file  . Food insecurity    Worry: Not on file    Inability: Not on file  . Transportation needs    Medical: Not on file    Non-medical: Not on file  Tobacco Use  . Smoking status: Never Smoker  . Smokeless tobacco: Never Used  Substance and Sexual Activity  . Alcohol use: Yes     Comment: rarely  . Drug use: No  . Sexual activity: Not on file  Lifestyle  . Physical activity    Days per week: Not on file    Minutes per session: Not on file  . Stress: Not on file  Relationships  . Social Musician on phone: Not on file    Gets together: Not on file    Attends religious service: Not on file    Active member of club or organization: Not on file    Attends meetings of clubs or organizations: Not on file    Relationship status: Not on file  . Intimate partner violence    Fear of current or ex partner: Not on file    Emotionally abused: Not on file    Physically abused: Not on file    Forced sexual activity: Not on file  Other Topics Concern  . Not on file  Social History Narrative   Fun: Play basketball, [employer]cal Lux   Denies religious beliefs effecting health care.     Review of Systems  Constitution: Negative for decreased appetite, malaise/fatigue, weight gain and weight loss.  Eyes: Negative for visual disturbance.  Cardiovascular: Negative for chest pain, claudication, dyspnea on exertion, leg swelling, orthopnea, palpitations and  syncope.  Respiratory: Negative for hemoptysis and wheezing.   Endocrine: Negative for cold intolerance and heat intolerance.  Hematologic/Lymphatic: Does not bruise/bleed easily.  Skin: Negative for nail changes.  Musculoskeletal: Negative for muscle weakness and myalgias.  Gastrointestinal: Positive for heartburn. Negative for abdominal pain, change in bowel habit, nausea and vomiting.  Neurological: Negative for difficulty with concentration, dizziness, focal weakness and headaches.  Psychiatric/Behavioral: Negative for altered mental status and suicidal ideas.  All other systems reviewed and are negative.     Objective:  Blood pressure 137/88, pulse 93, temperature (!) 97.5 F (36.4 C), height 5\' 11"  (1.803 m), weight 283 lb (128.4 kg), SpO2 97 %. Body mass index is 39.47 kg/m.    Physical Exam   Constitutional: He is oriented to person, place, and time. Vital signs are normal. He appears well-developed and well-nourished.  HENT:  Head: Normocephalic and atraumatic.  Neck: Normal range of motion. Neck supple.  Cardiovascular: Normal rate, regular rhythm, normal heart sounds and intact distal pulses. Frequent extrasystoles are present.  Pulses:      Femoral pulses are 2+ on the right side and 2+ on the left side.      Popliteal pulses are 2+ on the right side and 2+ on the left side.       Dorsalis pedis pulses are 2+ on the right side and 2+ on the left side.       Posterior tibial pulses are 2+ on the right side and 2+ on the left side.  Pulmonary/Chest: Effort normal and breath sounds normal. No accessory muscle usage. No respiratory distress.  Abdominal: Soft. Bowel sounds are normal.  Musculoskeletal: Normal range of motion.  Neurological: He is alert and oriented to person, place, and time.  Skin: Skin is warm and dry.  Vitals reviewed.  Radiology: No results found.  Laboratory examination:    CMP Latest Ref Rng & Units 07/20/2018 01/09/2018 01/09/2016  Glucose 65 - 99 mg/dL 161(W280(H) 960(A133(H) 540(J232(H)  BUN 6 - 24 mg/dL 21 16 19   Creatinine 0.76 - 1.27 mg/dL 8.11(B1.35(H) 1.47(W1.63(H) 2.95(A1.72(H)  Sodium 134 - 144 mmol/L 140 129(L) 136  Potassium 3.5 - 5.2 mmol/L 4.7 3.6 4.3  Chloride 96 - 106 mmol/L 101 95(L) 101  CO2 20 - 29 mmol/L 26 26 22   Calcium 8.7 - 10.2 mg/dL 8.8 2.1(H8.5(L) 8.8  Total Protein 6.1 - 8.1 g/dL - - 6.9  Total Bilirubin 0.2 - 1.2 mg/dL - - 0.4  Alkaline Phos 40 - 115 U/L - - 71  AST 10 - 40 U/L - - 18  ALT 9 - 46 U/L - - 22   CBC Latest Ref Rng & Units 01/09/2018 04/27/2014  WBC 4.0 - 10.5 K/uL 12.1(H) 8.0  Hemoglobin 13.0 - 17.0 g/dL 08.613.5 57.815.3  Hematocrit 46.939.0 - 52.0 % 43.6 45.9  Platelets 150 - 400 K/uL 243 220   Lipid Panel     Component Value Date/Time   CHOL 118 01/09/2016 1712   TRIG 190.0 (H) 01/09/2016 1712   HDL 26.20 (L) 01/09/2016 1712   CHOLHDL 5  01/09/2016 1712   VLDL 38.0 01/09/2016 1712   LDLCALC 54 01/09/2016 1712   HEMOGLOBIN A1C Lab Results  Component Value Date   HGBA1C 10.7 01/09/2016   TSH No results for input(s): TSH in the last 8760 hours.  PRN Meds:. There are no discontinued medications. Current Meds  Medication Sig  . amphetamine-dextroamphetamine (ADDERALL XR) 25 MG 24 hr capsule daily.  [name]. atorvastatin (LIPITOR) 20 MG tablet  TAKE 1 TABLET EVERY DAY  . BYSTOLIC 20 MG TABS Take 1 tablet by mouth daily.  . Dulaglutide (TRULICITY) 1.82 XH/3.7JI SOPN Inject into the skin once a week.  [name] glucose blood test strip 1 each by Other route 3 (three) times daily. Use as instructed  . insulin aspart (NOVOLOG FLEXPEN) 100 UNIT/ML FlexPen Inject into the skin 3 (three) times daily with meals. Sliding scale  . Insulin Glargine (BASAGLAR KWIKPEN) 100 UNIT/ML SOPN Inject 45 Units into the skin daily.  . isosorbide-hydrALAZINE (BIDIL) 20-37.5 MG tablet Take 1 tablet by mouth 3 (three) times daily.  . Olmesartan-amLODIPine-HCTZ (TRIBENZOR) 40-10-25 MG TABS Take 1 tablet by mouth daily.  [name] omeprazole (PRILOSEC) 40 MG capsule Take by mouth daily.  . ONE TOUCH ULTRA TEST test strip USE TO TEST BLOOD SUGAR 4 TIMES A DAY AS INSTRUCTED. DX CODE: E10.65    Cardiac Studies:   Echocardiogram 08/26/2018 :  Left ventricle cavity is normal in size. Moderate concentric hypertrophy of the left ventricle. 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  Normal LV systolic function with EF 58%. Normal global wall motion. Doppler evidence of grade I (impaired) diastolic dysfunction, normal LAP. Calculated EF 58%. Left atrial cavity is moderately dilated at 4.5 cm. Mild (Grade I) mitral regurgitation.  Renal artery duplex  08/26/2018: No evidence of renal artery occlusive disease in either renal artery. Right kidney measures 10.5 x 5 x 5 cm, Left kidney measures 10.3 x 5.4 x 5.4 cm.  Normal bilateral intrarenal resistive index.  Assessment:     ICD-10-CM    1. Resistant hypertension  I10   2. Morbid obesity ([place])  E66.01   3. 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 T wave abnormality in lateral leads, cannot exclude ischemia vs. repolarization abnormality.   Recommendations:   Is here on a two-month office visit and follow-up of hypertension, since addition of midodrine, blood pressure has improved significantly and essentially close to normal.  He has been taking BiDil once a day, advised him to increase it to twice a day at least.  This is due to scheduling conflicts that he is not able to take the medication 3 times a day.  I discussed regarding his morbid obesity and also uncontrolled diabetes.  Patient is motivated in weight loss and has been trying his best, he has gone through bariatric program screening at [facility], but would like to change to [facility] surgery.  I relayed a referral for the same. I have also discussed with him regarding risk of development of atrial fibrillation. I'd like to see him back in 6 months or sooner if problems.  Adrian Prows, MD, [facility] 12/08/2018, 9:42 AM [facility]. [place] Pager: [phone] Office: [phone] If no answer Cell [phone]

## 2019-01-10 ENCOUNTER — Other Ambulatory Visit: Payer: Self-pay

## 2019-01-10 DIAGNOSIS — Z20822 Contact with and (suspected) exposure to covid-19: Secondary | ICD-10-CM

## 2019-01-11 LAB — NOVEL CORONAVIRUS, NAA: SARS-CoV-2, NAA: NOT DETECTED

## 2019-01-20 ENCOUNTER — Ambulatory Visit (INDEPENDENT_AMBULATORY_CARE_PROVIDER_SITE_OTHER): Payer: Managed Care, Other (non HMO) | Admitting: Cardiology

## 2019-01-20 ENCOUNTER — Encounter: Payer: Self-pay | Admitting: Cardiology

## 2019-01-20 ENCOUNTER — Other Ambulatory Visit: Payer: Self-pay

## 2019-01-20 VITALS — BP 137/90 | HR 88 | Temp 97.7°F | Ht 71.0 in | Wt 285.0 lb

## 2019-01-20 DIAGNOSIS — E103299 Type 1 diabetes mellitus with mild nonproliferative diabetic retinopathy without macular edema, unspecified eye: Secondary | ICD-10-CM

## 2019-01-20 DIAGNOSIS — R0600 Dyspnea, unspecified: Secondary | ICD-10-CM | POA: Diagnosis not present

## 2019-01-20 DIAGNOSIS — I10 Essential (primary) hypertension: Secondary | ICD-10-CM | POA: Diagnosis not present

## 2019-01-20 DIAGNOSIS — R0609 Other forms of dyspnea: Secondary | ICD-10-CM

## 2019-01-20 DIAGNOSIS — I491 Atrial premature depolarization: Secondary | ICD-10-CM

## 2019-01-20 DIAGNOSIS — IMO0002 Reserved for concepts with insufficient information to code with codable children: Secondary | ICD-10-CM

## 2019-01-20 DIAGNOSIS — E1065 Type 1 diabetes mellitus with hyperglycemia: Secondary | ICD-10-CM

## 2019-01-20 NOTE — Progress Notes (Signed)
Primary Physician:  [name], MD   Patient ID: Johnny Hicks, male    DOB: [birth year], 43 y.o.   MRN: [id number]  Subjective:    Chief Complaint  Patient presents with  . Shortness of Breath  . Follow-up    6 week   HPI: Clemons Salvucci  is a 43 y.o. male  with uncontrolled type 1 diabetes, with difficult to control hypertension.  He underwent echocardiogram on 08/26/2018 that showed moderate LVH, grade 1 diastolic dysfunction, normal LVEF. Also underwent renal artery duplex at that time that did not reveal any renal artery stenosis. Aldosterone/renin ratio was also within normal limits.   Since being on BiDil, states that blood pressure is much improved.  He is still mostly only taking Bidil once a day. Diabetes is doing much better.  He is followed by [facility].   He made an appt to see me today due to feeling tired with previously well tolerated activities. States that he does not seem to have the energy to be able to complete his normal activities and gets short of breath. Denies any chest pain. No leg swelling.   Family history of heart disease in his father at age 78. Also reports that his sister has had some heart issues (unsure).   He does try to obtain 70,000 steps per week. 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  Past Medical History:  Diagnosis Date  . ADHD (attention deficit hyperactivity disorder)   . Diabetes mellitus without complication (HCC)    Type 1  . Hyperlipidemia   . Hypertension     Past Surgical History:  Procedure Laterality Date  . KNEE ARTHROSCOPY    . NASAL SEPTUM SURGERY    . SHOULDER SURGERY      Social History   Socioeconomic History  . Marital status: Married    Spouse name: Not on file  . Number of children: 2  . Years of education: 69  . Highest education level: Not on file  Occupational History  . Not on file  Social Needs  . Financial resource strain: Not on file  .  Food insecurity    Worry: Not on file    Inability: Not on file  . Transportation needs    Medical: Not on file    Non-medical: Not on file  Tobacco Use  . Smoking status: Never Smoker  . Smokeless tobacco: Never Used  Substance and Sexual Activity  . Alcohol use: Yes    Comment: rarely  . Drug use: No  . Sexual activity: Not on file  Lifestyle  . Physical activity    Days per week: Not on file    Minutes per session: Not on file  . Stress: Not on file  Relationships  . Social Herbalist on phone: Not on file    Gets together: Not on file    Attends religious service: Not on file    Active member of club or organization: Not on file    Attends meetings of clubs or organizations: Not on file    Relationship status: Not on file  . Intimate partner violence    Fear of current or ex partner: Not on file    Emotionally abused: Not on file    Physically abused: Not on file    Forced sexual activity: Not on file  Other Topics Concern  . Not on file  Social History Narrative  Fun: Play basketball, Pascal LuxGrilling   Denies religious beliefs effecting health care.     Review of Systems  Constitution: Negative for decreased appetite, malaise/fatigue, weight gain and weight loss.  Eyes: Negative for visual disturbance.  Cardiovascular: Negative for chest pain, claudication, dyspnea on exertion, leg swelling, orthopnea, palpitations and syncope.  Respiratory: Negative for hemoptysis and wheezing.   Endocrine: Negative for cold intolerance and heat intolerance.  Hematologic/Lymphatic: Does not bruise/bleed easily.  Skin: Negative for nail changes.  Musculoskeletal: Negative for muscle weakness and myalgias.  Gastrointestinal: Positive for heartburn. Negative for abdominal pain, change in bowel habit, nausea and vomiting.  Neurological: Negative for difficulty with concentration, dizziness, focal weakness and headaches.  Psychiatric/Behavioral: Negative for altered mental  status and suicidal ideas.  All other systems reviewed and are negative.     Objective:  Blood pressure 137/90, pulse 88, temperature 97.7 F (36.5 C), height 5\' 11"  (1.803 m), weight 285 lb (129.3 kg), SpO2 97 %. Body mass index is 39.75 kg/m.    Physical Exam  Constitutional: He is oriented to person, place, and time. Vital signs are normal. He appears well-developed and well-nourished.  HENT:  Head: Normocephalic and atraumatic.  Neck: Normal range of motion. Neck supple.  Cardiovascular: Normal rate, regular rhythm, normal heart sounds and intact distal pulses. Frequent extrasystoles are present.  Pulses:      Femoral pulses are 2+ on the right side and 2+ on the left side.      Popliteal pulses are 2+ on the right side and 2+ on the left side.       Dorsalis pedis pulses are 2+ on the right side and 2+ on the left side.       Posterior tibial pulses are 2+ on the right side and 2+ on the left side.  Pulmonary/Chest: Effort normal and breath sounds normal. No accessory muscle usage. No respiratory distress.  Abdominal: Soft. Bowel sounds are normal.  Musculoskeletal: Normal range of motion.  Neurological: He is alert and oriented to person, place, and time.  Skin: Skin is warm and dry.  Vitals reviewed.  Radiology: No results found.  Laboratory examination:    CMP Latest Ref Rng & Units 07/20/2018 01/09/2018 01/09/2016  Glucose 65 - 99 mg/dL 259(D280(H) 638(V133(H) 564(P232(H)  BUN 6 - 24 mg/dL 21 16 19   Creatinine 0.76 - 1.27 mg/dL 3.29(J1.35(H) 1.88(C1.63(H) 1.66(A1.72(H)  Sodium 134 - 144 mmol/L 140 129(L) 136  Potassium 3.5 - 5.2 mmol/L 4.7 3.6 4.3  Chloride 96 - 106 mmol/L 101 95(L) 101  CO2 20 - 29 mmol/L 26 26 22   Calcium 8.7 - 10.2 mg/dL 8.8 6.3(K8.5(L) 8.8  Total Protein 6.1 - 8.1 g/dL - - 6.9  Total Bilirubin 0.2 - 1.2 mg/dL - - 0.4  Alkaline Phos 40 - 115 U/L - - 71  AST 10 - 40 U/L - - 18  ALT 9 - 46 U/L - - 22   CBC Latest Ref Rng & Units 01/09/2018 04/27/2014  WBC 4.0 - 10.5 K/uL 12.1(H)  8.0  Hemoglobin 13.0 - 17.0 g/dL 16.013.5 10.915.3  Hematocrit 32.339.0 - 52.0 % 43.6 45.9  Platelets 150 - 400 K/uL 243 220   Lipid Panel     Component Value Date/Time   CHOL 118 01/09/2016 1712   TRIG 190.0 (H) 01/09/2016 1712   HDL 26.20 (L) 01/09/2016 1712   CHOLHDL 5 01/09/2016 1712   VLDL 38.0 01/09/2016 1712   LDLCALC 54 01/09/2016 1712   HEMOGLOBIN A1C Lab Results  Component  Value Date   HGBA1C 10.7 01/09/2016   TSH No results for input(s): TSH in the last 8760 hours.  PRN Meds:. There are no discontinued medications. Current Meds  Medication Sig  . amphetamine-dextroamphetamine (ADDERALL XR) 25 MG 24 hr capsule daily.  [name] atorvastatin (LIPITOR) 20 MG tablet TAKE 1 TABLET EVERY DAY  . BYSTOLIC 20 MG TABS Take 1 tablet by mouth daily.  . Dulaglutide (TRULICITY) 0.75 MG/0.5ML SOPN Inject into the skin once a week.  [name] glucose blood test strip 1 each by Other route 3 (three) times daily. Use as instructed  . insulin aspart (NOVOLOG FLEXPEN) 100 UNIT/ML FlexPen Inject into the skin 3 (three) times daily with meals. Sliding scale  . Insulin Glargine (BASAGLAR KWIKPEN) 100 UNIT/ML SOPN Inject 45 Units into the skin daily.  . isosorbide-hydrALAZINE (BIDIL) 20-37.5 MG tablet Take 1 tablet by mouth 3 (three) times daily.  . Olmesartan-amLODIPine-HCTZ (TRIBENZOR) 40-10-25 MG TABS Take 1 tablet by mouth daily.  [name] omeprazole (PRILOSEC) 40 MG capsule Take by mouth daily.  . ONE TOUCH ULTRA TEST test strip USE TO TEST BLOOD SUGAR 4 TIMES A DAY AS INSTRUCTED. DX CODE: E10.65    Cardiac Studies:   Echocardiogram 08/26/2018 :  Left ventricle cavity is normal in size. Moderate concentric hypertrophy of the left ventricle. 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  Normal LV systolic function with EF 58%. Normal global wall motion. Doppler evidence of grade I (impaired) diastolic dysfunction, normal LAP. Calculated EF 58%. Left atrial cavity is moderately dilated at 4.5 cm. Mild (Grade I) mitral  regurgitation.  Renal artery duplex  08/26/2018: No evidence of renal artery occlusive disease in either renal artery. Right kidney measures 10.5 x 5 x 5 cm, Left kidney measures 10.3 x 5.4 x 5.4 cm.  Normal bilateral intrarenal resistive index.  Assessment:     ICD-10-CM   1. Resistant hypertension  I10 EKG 12-Lead  2. Dyspnea on exertion  R06.00 PCV MYOCARDIAL PERFUSION WO LEXISCAN  3. PAC (premature atrial contraction)  I49.1 Holter monitor - 48 hour  4. 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 No evidence of ischemia  Recommendations:   Patient seen dyspnea on exertion and decreased exercise tolerance for the last 3 weeks.  States that he is unable to can fully complete previously well tolerated activities without fatigue and shortness of breath.  No chest discomfort.  No acute changes are noted to his EKG.  He does have frequent PACs on examination.  His blood pressure, although was significantly improved, diastolic readings continue to be slightly elevated.  I have encouraged him to take BiDil at least twice a day.  We will continue to closely monitor.  Given his diabetes, and other risk factors for CAD, and new onset symptoms, will further evaluate with exercise nuclear stress testing.  Advised him to take it easy until he can have further work-up.  I will also place him on 48-hour Holter monitor to evaluate his PAC burden.  He is asymptomatic in regards to PACs.  Atrial fibrillation will need to be evaluated for given his risk factors for this.  No changes were made to his medications today.  I will see him back after the test for further recommendations and reevaluation.

## 2019-01-30 ENCOUNTER — Other Ambulatory Visit: Payer: Self-pay

## 2019-01-30 ENCOUNTER — Ambulatory Visit: Payer: Managed Care, Other (non HMO)

## 2019-01-30 DIAGNOSIS — I491 Atrial premature depolarization: Secondary | ICD-10-CM

## 2019-01-31 ENCOUNTER — Other Ambulatory Visit: Payer: Self-pay

## 2019-01-31 MED ORDER — BIDIL 20-37.5 MG PO TABS: 1.0000 | ORAL_TABLET | Freq: Three times a day (TID) | ORAL | 1 refills | Status: DC

## 2019-02-01 ENCOUNTER — Ambulatory Visit (INDEPENDENT_AMBULATORY_CARE_PROVIDER_SITE_OTHER): Payer: Managed Care, Other (non HMO)

## 2019-02-01 ENCOUNTER — Other Ambulatory Visit: Payer: Self-pay

## 2019-02-01 DIAGNOSIS — R0600 Dyspnea, unspecified: Secondary | ICD-10-CM

## 2019-02-01 DIAGNOSIS — R0609 Other forms of dyspnea: Secondary | ICD-10-CM

## 2019-02-01 NOTE — Telephone Encounter (Signed)
From pt

## 2019-02-02 DIAGNOSIS — I491 Atrial premature depolarization: Secondary | ICD-10-CM | POA: Diagnosis not present

## 2019-02-03 NOTE — Telephone Encounter (Signed)
Please read

## 2019-02-06 ENCOUNTER — Telehealth: Payer: Self-pay | Admitting: Cardiology

## 2019-02-06 DIAGNOSIS — R0600 Dyspnea, unspecified: Secondary | ICD-10-CM

## 2019-02-06 DIAGNOSIS — R0609 Other forms of dyspnea: Secondary | ICD-10-CM

## 2019-02-06 NOTE — Telephone Encounter (Signed)
Discussed echo and monitor results with the patient, will schedule for echocardiogram given low EF noted on stress test to confirm. Will schedule follow up appt after echo to discuss.

## 2019-02-07 ENCOUNTER — Ambulatory Visit (INDEPENDENT_AMBULATORY_CARE_PROVIDER_SITE_OTHER): Payer: Managed Care, Other (non HMO)

## 2019-02-07 ENCOUNTER — Other Ambulatory Visit: Payer: Self-pay

## 2019-02-07 DIAGNOSIS — R0600 Dyspnea, unspecified: Secondary | ICD-10-CM

## 2019-02-07 DIAGNOSIS — R0609 Other forms of dyspnea: Secondary | ICD-10-CM | POA: Diagnosis not present

## 2019-02-07 NOTE — Telephone Encounter (Signed)
Please read

## 2019-02-08 NOTE — Telephone Encounter (Signed)
Is this question to me??

## 2019-02-14 ENCOUNTER — Ambulatory Visit: Payer: Managed Care, Other (non HMO) | Admitting: Cardiology

## 2019-03-01 ENCOUNTER — Ambulatory Visit
Admission: RE | Admit: 2019-03-01 | Discharge: 2019-03-01 | Disposition: A | Discharge status: Home / Self Care | Payer: Managed Care, Other (non HMO) | Source: Ambulatory Visit | Attending: Family Medicine | Admitting: Family Medicine

## 2019-03-01 ENCOUNTER — Other Ambulatory Visit: Payer: Self-pay | Admitting: Family Medicine

## 2019-03-01 ENCOUNTER — Other Ambulatory Visit: Payer: Self-pay

## 2019-03-01 DIAGNOSIS — R0602 Shortness of breath: Secondary | ICD-10-CM

## 2019-03-21 ENCOUNTER — Ambulatory Visit: Payer: Managed Care, Other (non HMO) | Attending: Internal Medicine

## 2019-03-21 ENCOUNTER — Other Ambulatory Visit: Payer: Self-pay

## 2019-03-21 DIAGNOSIS — Z20822 Contact with and (suspected) exposure to covid-19: Secondary | ICD-10-CM

## 2019-03-22 LAB — NOVEL CORONAVIRUS, NAA: SARS-CoV-2, NAA: NOT DETECTED

## 2019-05-25 ENCOUNTER — Ambulatory Visit: Payer: Managed Care, Other (non HMO) | Attending: Internal Medicine

## 2019-05-25 DIAGNOSIS — Z23 Encounter for immunization: Secondary | ICD-10-CM

## 2019-05-25 NOTE — Progress Notes (Signed)
   Covid-19 Vaccination Clinic  Name:  Philopateer Strine    MRN: [id number] DOB: 08/22/1975  05/25/2019  Mr. Bevins was observed post Covid-19 immunization for 15 minutes without incident. He was provided with Vaccine Information Sheet and instruction to access the V-Safe system.   Mr. Gulino was instructed to call 911 with any severe reactions post vaccine: [name] Difficulty breathing  . Swelling of face and throat  . A fast heartbeat  . A bad rash all over body  . Dizziness and weakness   Immunizations Administered    Name Date Dose VIS Date Route   Moderna COVID-19 Vaccine 05/25/2019 11:33 AM 0.5 mL 01/31/2019 Intramuscular   Manufacturer: Moderna   Lot: [id number]   NDC: 48185-909-31

## 2019-05-30 ENCOUNTER — Other Ambulatory Visit: Payer: Self-pay

## 2019-05-30 ENCOUNTER — Ambulatory Visit: Payer: Managed Care, Other (non HMO) | Attending: Internal Medicine

## 2019-05-30 DIAGNOSIS — Z20822 Contact with and (suspected) exposure to covid-19: Secondary | ICD-10-CM

## 2019-05-31 LAB — SARS-COV-2, NAA 2 DAY TAT

## 2019-05-31 LAB — NOVEL CORONAVIRUS, NAA: SARS-CoV-2, NAA: NOT DETECTED

## 2019-06-08 ENCOUNTER — Ambulatory Visit: Payer: Managed Care, Other (non HMO) | Admitting: Cardiology

## 2019-06-12 ENCOUNTER — Ambulatory Visit: Payer: Managed Care, Other (non HMO) | Admitting: Cardiology

## 2019-06-22 ENCOUNTER — Ambulatory Visit: Payer: Managed Care, Other (non HMO)

## 2019-07-04 ENCOUNTER — Ambulatory Visit: Payer: Managed Care, Other (non HMO) | Attending: Internal Medicine

## 2019-07-04 DIAGNOSIS — Z23 Encounter for immunization: Secondary | ICD-10-CM

## 2019-07-04 NOTE — Progress Notes (Signed)
   Covid-19 Vaccination Clinic  Name:  Johnny Hicks    MRN: [id number] DOB: [birth year]  07/04/2019  Mr. Caliendo was observed post Covid-19 immunization for 15 minutes without incident. He was provided with Vaccine Information Sheet and instruction to access the V-Safe system.   Mr. Sanger was instructed to call 911 with any severe reactions post vaccine: [name] Difficulty breathing  . Swelling of face and throat  . A fast heartbeat  . A bad rash all over body  . Dizziness and weakness   Immunizations Administered    Name Date Dose VIS Date Route   Moderna COVID-19 Vaccine 07/04/2019  9:21 AM 0.5 mL 01/2019 Intramuscular   Manufacturer: Moderna   Lot: [id number]   NDC: 68403-353-31

## 2019-08-01 ENCOUNTER — Other Ambulatory Visit: Payer: Self-pay

## 2019-08-01 MED ORDER — BIDIL 20-37.5 MG PO TABS: 1.0000 | ORAL_TABLET | Freq: Three times a day (TID) | ORAL | 3 refills | Status: AC

## 2019-10-01 ENCOUNTER — Other Ambulatory Visit: Payer: Self-pay | Admitting: Cardiology

## 2019-10-18 ENCOUNTER — Other Ambulatory Visit: Payer: Self-pay | Admitting: Cardiology

## 2020-06-11 IMAGING — CT CT HEAD W/O CM
4 series · 16 of 47 positions shown, 18 images · non-contrast
Comparison: None.

CLINICAL DATA: Headache for 3 days.  Recent sinus surgery.

EXAM:
CT HEAD WITHOUT CONTRAST
TECHNIQUE: Contiguous axial images were obtained from the base of the skull
through the vertex without intravenous contrast.

[Series 3: head without · axial · non-contrast · 0.47mm/px · z∈[-106,+24]mm · 7 of 36 slices shown, 9 images]
[im 5/36  brain]
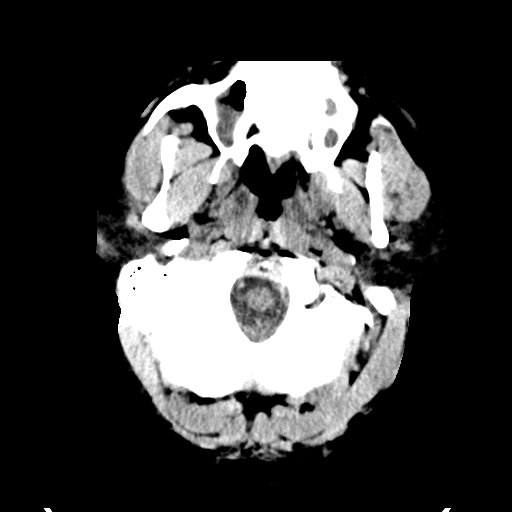
[im 5/36  bone]
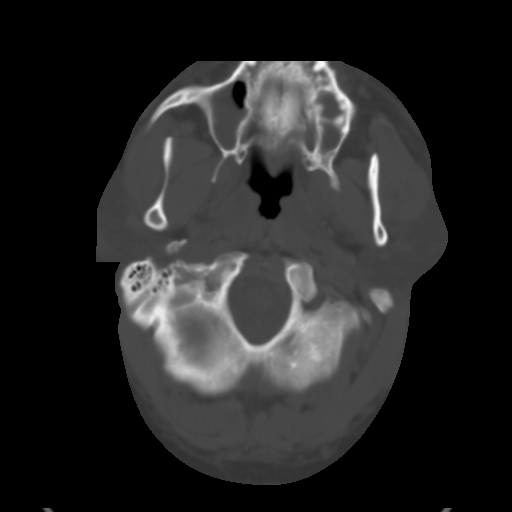
[im 9/36  brain]
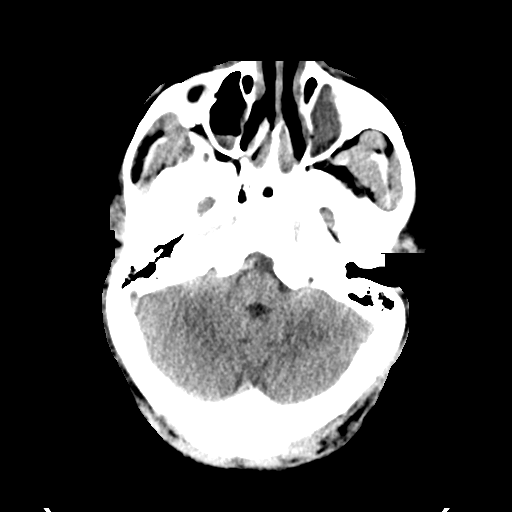
[im 14/36  brain]
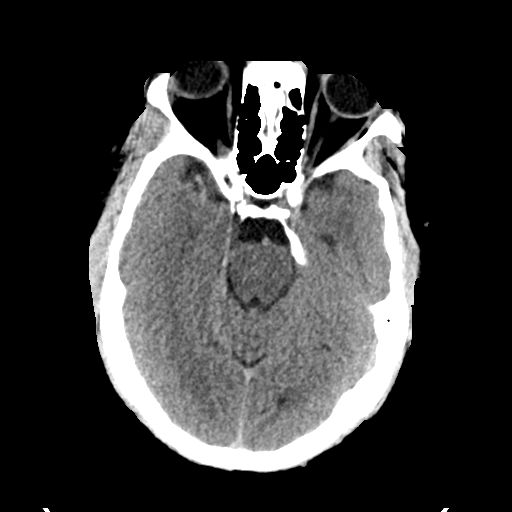
[im 18/36  brain]
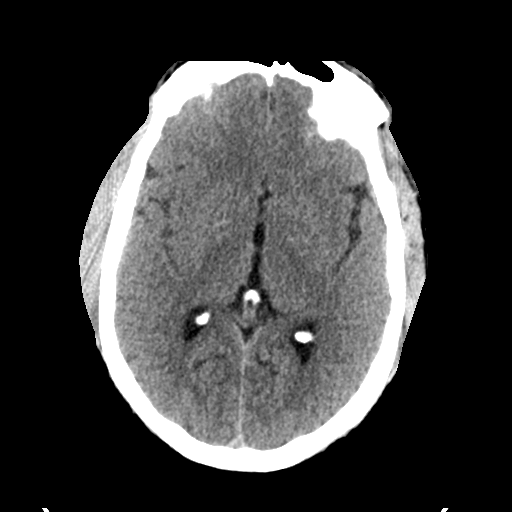
[im 22/36  brain]
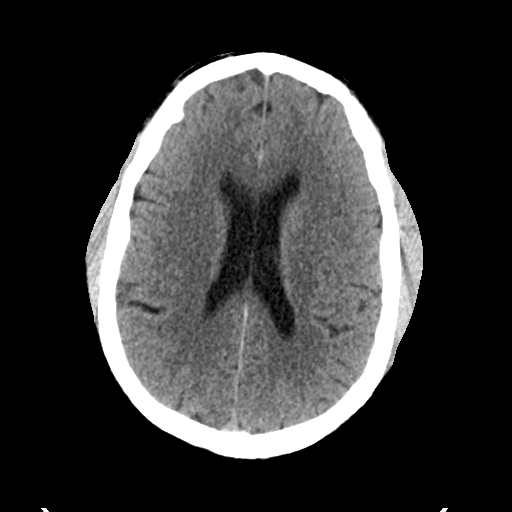
[im 22/36  bone]
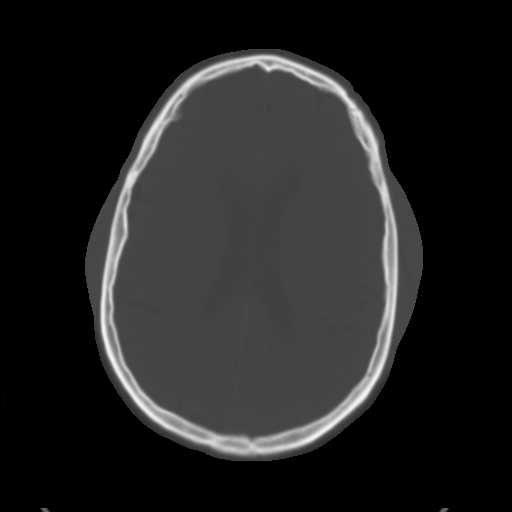
[im 27/36  brain]
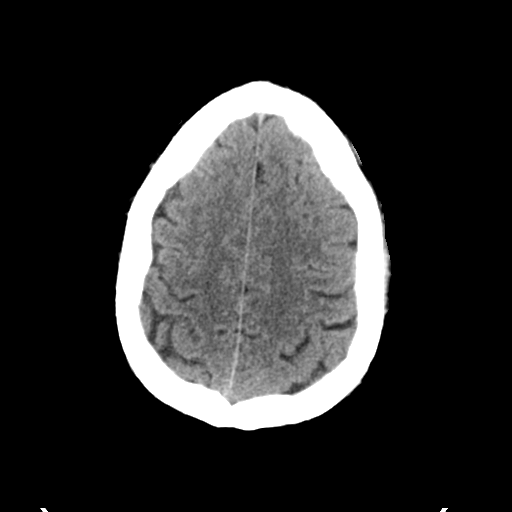
[im 31/36  brain]
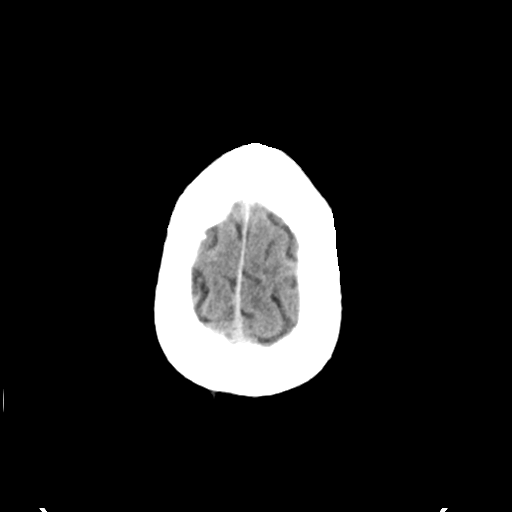

[Series 4: head bone · axial · 0.47mm/px · z∈[-110,-74]mm · 3 of 89 slices shown]
[im 9/89  bone]
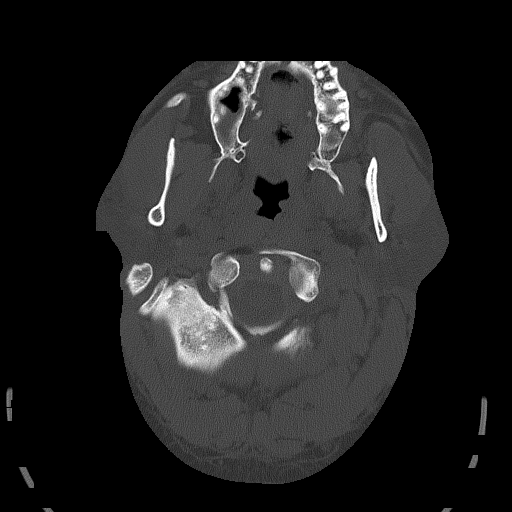
[im 18/89  bone]
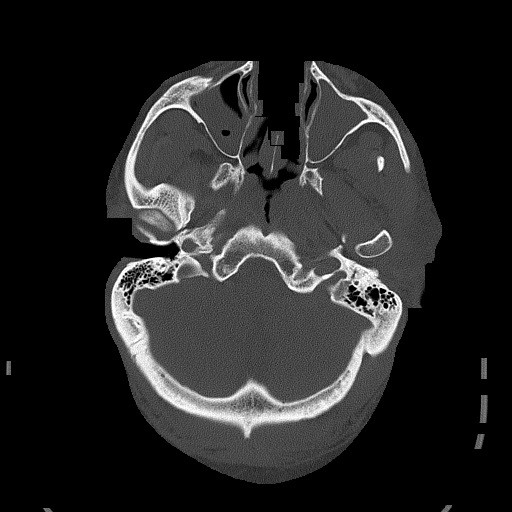
[im 27/89  bone]
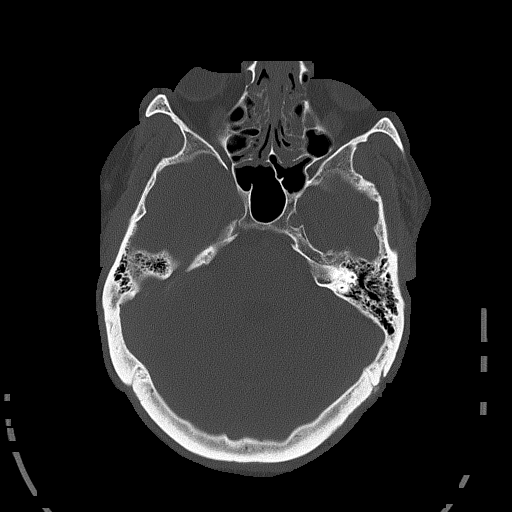

[Series 5: head without cor · coronal · non-contrast · 0.33mm/px · 3 of 74 slices shown]
[im 25/74  brain]
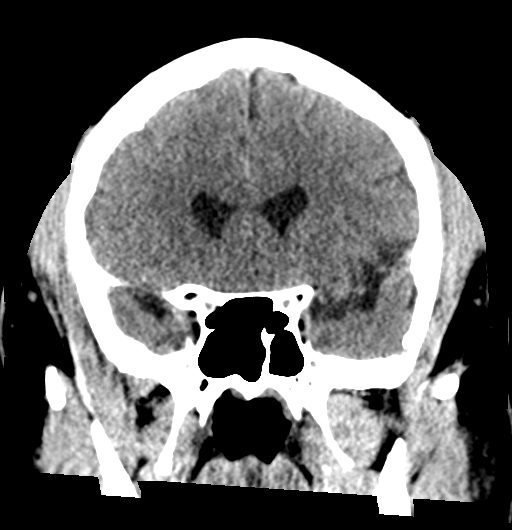
[im 33/74  brain]
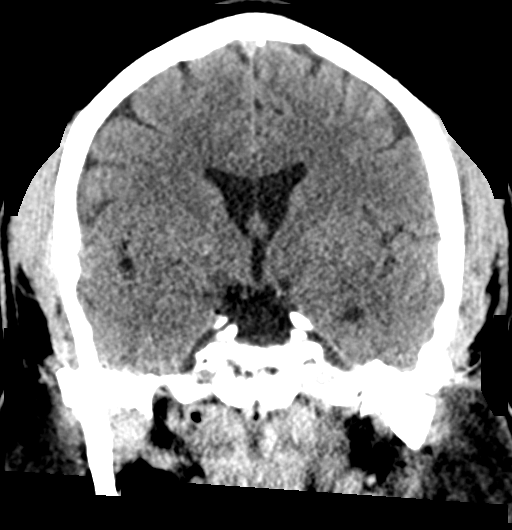
[im 41/74  brain]
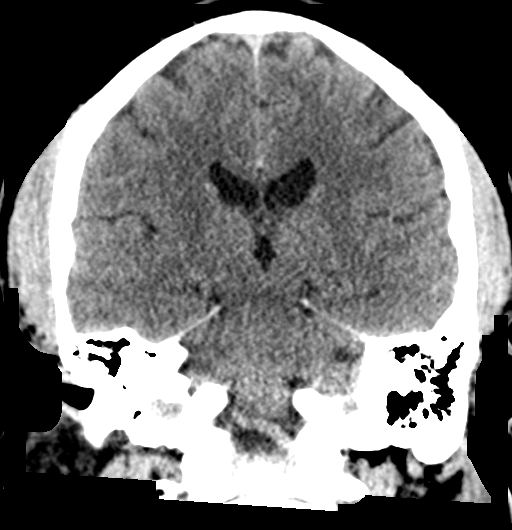

[Series 6: head without sag · sagittal · non-contrast · 0.35mm/px · 3 of 67 slices shown]
[im 23/67  brain]
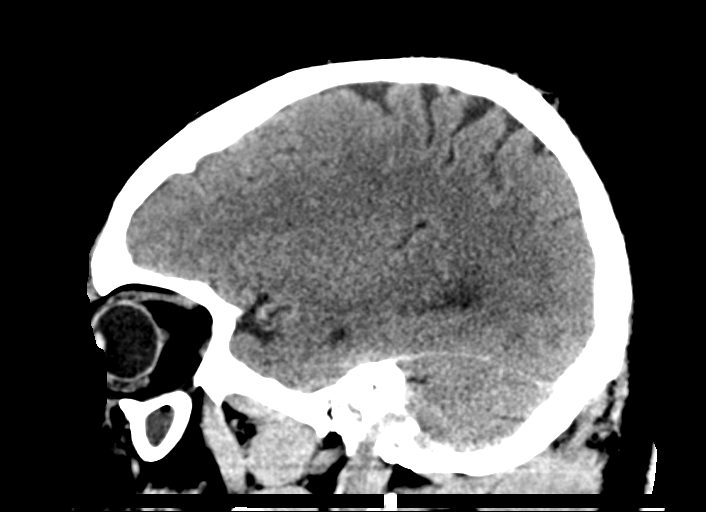
[im 34/67  brain]
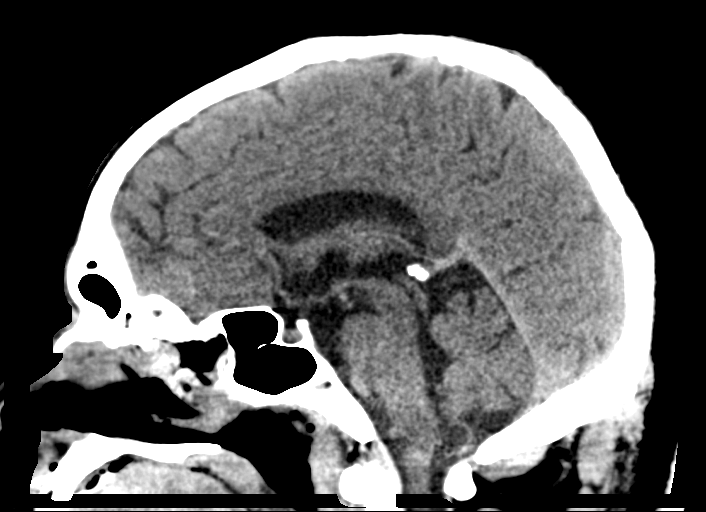
[im 45/67  brain]
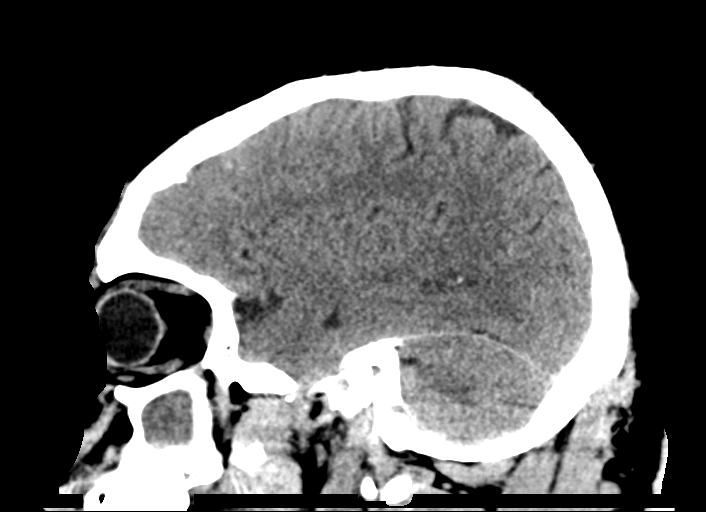

[16 of 47 positions shown; findings below may reference images not displayed]

FINDINGS: Brain: No evidence of acute infarction, hemorrhage, hydrocephalus,
extra-axial collection or mass lesion/mass effect.

Vascular: No hyperdense vessel or unexpected calcification.

Skull: Normal. Negative for fracture or focal lesion.

Sinuses/Orbits: Normal globes and orbits.

There is sinus disease. Left maxillary sinus is mostly opacified.
There is moderate mucosal thickening with dependent fluid in the
right maxillary sinus. Moderate mucosal thickening lines the ethmoid
air cells. Mild mucosal thickening in the inferior left frontal
sinus with minor mucosal thickening along the anterior sphenoid
sinuses.

Clear mastoid air cells.

Other: None.
IMPRESSION: 1. No intracranial abnormalities.
2. Sinus disease as detailed above, including an air-fluid level in
the right maxillary sinus.
